# Patient Record
Sex: Male | Born: 1960 | Race: White | Hispanic: No | Marital: Married | Smoking: Never smoker
Health system: Southern US, Community
[De-identification: ages and names within clinical notes are randomized; demographics above are authoritative.]

## PROBLEM LIST (undated history)

## (undated) DIAGNOSIS — I10 Essential (primary) hypertension: Secondary | ICD-10-CM

## (undated) DIAGNOSIS — E039 Hypothyroidism, unspecified: Secondary | ICD-10-CM

## (undated) HISTORY — DX: Essential (primary) hypertension: I10

## (undated) HISTORY — PX: EYE SURGERY: SHX253

---

## 2001-02-27 ENCOUNTER — Encounter: Payer: Self-pay | Admitting: Family Medicine

## 2001-02-27 ENCOUNTER — Ambulatory Visit (HOSPITAL_COMMUNITY): Admission: RE | Admit: 2001-02-27 | Discharge: 2001-02-27 | Payer: Self-pay | Admitting: Family Medicine

## 2003-09-02 ENCOUNTER — Ambulatory Visit (HOSPITAL_COMMUNITY): Admission: RE | Admit: 2003-09-02 | Discharge: 2003-09-02 | Payer: Self-pay | Admitting: Family Medicine

## 2005-11-18 ENCOUNTER — Emergency Department (HOSPITAL_COMMUNITY): Admission: EM | Admit: 2005-11-18 | Discharge: 2005-11-18 | Payer: Self-pay | Admitting: Emergency Medicine

## 2006-02-05 ENCOUNTER — Emergency Department (HOSPITAL_COMMUNITY): Admission: EM | Admit: 2006-02-05 | Discharge: 2006-02-05 | Payer: Self-pay | Admitting: Emergency Medicine

## 2008-09-30 ENCOUNTER — Ambulatory Visit (HOSPITAL_COMMUNITY): Admission: RE | Admit: 2008-09-30 | Discharge: 2008-09-30 | Payer: Self-pay | Admitting: Family Medicine

## 2010-07-10 ENCOUNTER — Emergency Department (HOSPITAL_COMMUNITY): Payer: BC Managed Care – PPO

## 2010-07-10 ENCOUNTER — Emergency Department (HOSPITAL_COMMUNITY)
Admission: EM | Admit: 2010-07-10 | Discharge: 2010-07-10 | Disposition: A | Discharge status: Home / Self Care | Payer: BC Managed Care – PPO | Attending: Emergency Medicine | Admitting: Emergency Medicine

## 2010-07-10 DIAGNOSIS — E119 Type 2 diabetes mellitus without complications: Secondary | ICD-10-CM | POA: Insufficient documentation

## 2010-07-10 DIAGNOSIS — R4182 Altered mental status, unspecified: Secondary | ICD-10-CM | POA: Insufficient documentation

## 2010-07-10 DIAGNOSIS — E039 Hypothyroidism, unspecified: Secondary | ICD-10-CM | POA: Insufficient documentation

## 2010-07-10 DIAGNOSIS — E7800 Pure hypercholesterolemia, unspecified: Secondary | ICD-10-CM | POA: Insufficient documentation

## 2010-07-10 DIAGNOSIS — Z794 Long term (current) use of insulin: Secondary | ICD-10-CM | POA: Insufficient documentation

## 2010-07-10 DIAGNOSIS — Z7982 Long term (current) use of aspirin: Secondary | ICD-10-CM | POA: Insufficient documentation

## 2010-07-10 DIAGNOSIS — Z79899 Other long term (current) drug therapy: Secondary | ICD-10-CM | POA: Insufficient documentation

## 2010-07-10 DIAGNOSIS — R569 Unspecified convulsions: Secondary | ICD-10-CM | POA: Insufficient documentation

## 2010-07-10 DIAGNOSIS — E860 Dehydration: Secondary | ICD-10-CM | POA: Insufficient documentation

## 2010-07-10 DIAGNOSIS — I10 Essential (primary) hypertension: Secondary | ICD-10-CM | POA: Insufficient documentation

## 2010-07-10 LAB — DIFFERENTIAL
Basophils Absolute: 0 10*3/uL (ref 0.0–0.1)
Basophils Relative: 0 % (ref 0–1)
Eosinophils Absolute: 0.2 10*3/uL (ref 0.0–0.7)
Eosinophils Relative: 2 % (ref 0–5)
Lymphs Abs: 1.1 10*3/uL (ref 0.7–4.0)
Neutrophils Relative %: 86 % — ABNORMAL HIGH (ref 43–77)

## 2010-07-10 LAB — URINALYSIS, ROUTINE W REFLEX MICROSCOPIC
Glucose, UA: NEGATIVE mg/dL
Ketones, ur: 15 mg/dL — AB
Leukocytes, UA: NEGATIVE
Protein, ur: 30 mg/dL — AB
pH: 5.5 (ref 5.0–8.0)

## 2010-07-10 LAB — BASIC METABOLIC PANEL
CO2: 27 mEq/L (ref 19–32)
Chloride: 100 mEq/L (ref 96–112)
Creatinine, Ser: 1.44 mg/dL — ABNORMAL HIGH (ref 0.50–1.35)
GFR calc Af Amer: >60 mL/min (ref >60)
Potassium: 3.6 mEq/L (ref 3.5–5.1)
Sodium: 137 mEq/L (ref 135–145)

## 2010-07-10 LAB — CBC
MCV: 88.6 fL (ref 78.0–100.0)
Platelets: 274 10*3/uL (ref 150–400)
RBC: 4.58 MIL/uL (ref 4.22–5.81)
RDW: 12.5 % (ref 11.5–15.5)
WBC: 14 10*3/uL — ABNORMAL HIGH (ref 4.0–10.5)

## 2010-07-10 LAB — URINE MICROSCOPIC-ADD ON

## 2010-07-10 LAB — CALCIUM: Calcium: 10.5 mg/dL (ref 8.4–10.5)

## 2010-07-10 LAB — MAGNESIUM: Magnesium: 2.3 mg/dL (ref 1.5–2.5)

## 2010-07-10 LAB — CK TOTAL AND CKMB (NOT AT ARMC)
CK, MB: 10.9 ng/mL (ref 0.3–4.0)
Relative Index: 1.5 (ref 0.0–2.5)
Total CK: 719 U/L — ABNORMAL HIGH (ref 7–232)

## 2010-07-11 LAB — GLUCOSE, CAPILLARY

## 2010-08-11 ENCOUNTER — Other Ambulatory Visit: Payer: Self-pay | Admitting: Neurology

## 2010-08-11 DIAGNOSIS — R569 Unspecified convulsions: Secondary | ICD-10-CM

## 2010-08-18 ENCOUNTER — Ambulatory Visit (HOSPITAL_COMMUNITY)
Admission: RE | Admit: 2010-08-18 | Discharge: 2010-08-18 | Disposition: A | Discharge status: Home / Self Care | Payer: BC Managed Care – PPO | Source: Ambulatory Visit | Attending: Neurology | Admitting: Neurology

## 2010-08-18 DIAGNOSIS — R569 Unspecified convulsions: Secondary | ICD-10-CM | POA: Insufficient documentation

## 2010-08-18 MED ORDER — GADOBENATE DIMEGLUMINE 529 MG/ML IV SOLN
20.0000 mL | Freq: Once | INTRAVENOUS | Status: AC | PRN
  Administered 2010-08-18: 20 mL via INTRAVENOUS

## 2011-01-20 ENCOUNTER — Other Ambulatory Visit (HOSPITAL_COMMUNITY): Payer: Self-pay | Admitting: Internal Medicine

## 2011-01-20 DIAGNOSIS — M2350 Chronic instability of knee, unspecified knee: Secondary | ICD-10-CM

## 2011-01-23 ENCOUNTER — Ambulatory Visit (HOSPITAL_COMMUNITY)
Admission: RE | Admit: 2011-01-23 | Discharge: 2011-01-23 | Disposition: A | Discharge status: Home / Self Care | Payer: BC Managed Care – PPO | Source: Ambulatory Visit | Attending: Internal Medicine | Admitting: Internal Medicine

## 2011-01-23 DIAGNOSIS — M2350 Chronic instability of knee, unspecified knee: Secondary | ICD-10-CM

## 2011-01-23 DIAGNOSIS — M23329 Other meniscus derangements, posterior horn of medial meniscus, unspecified knee: Secondary | ICD-10-CM | POA: Insufficient documentation

## 2011-01-23 DIAGNOSIS — M25569 Pain in unspecified knee: Secondary | ICD-10-CM | POA: Insufficient documentation

## 2011-02-21 ENCOUNTER — Ambulatory Visit (INDEPENDENT_AMBULATORY_CARE_PROVIDER_SITE_OTHER): Payer: BC Managed Care – PPO | Admitting: Orthopedic Surgery

## 2011-02-21 ENCOUNTER — Encounter: Payer: Self-pay | Admitting: Orthopedic Surgery

## 2011-02-21 VITALS — BP 138/70 | Ht 71.0 in | Wt 248.0 lb

## 2011-02-21 DIAGNOSIS — IMO0002 Reserved for concepts with insufficient information to code with codable children: Secondary | ICD-10-CM

## 2011-02-21 DIAGNOSIS — M1710 Unilateral primary osteoarthritis, unspecified knee: Secondary | ICD-10-CM | POA: Insufficient documentation

## 2011-02-21 DIAGNOSIS — M23329 Other meniscus derangements, posterior horn of medial meniscus, unspecified knee: Secondary | ICD-10-CM

## 2011-02-21 NOTE — Patient Instructions (Signed)
Torn medial meniscus  Recommend  Surgery: arthroscopy right knee partial medial menisectomy  Plan:  Feb 22nd surgery

## 2011-02-21 NOTE — Progress Notes (Signed)
Patient ID: OLSON LUCARELLI, male   DOB: 02/21/1960, 51 y.o.   MRN: [id number]  This procedure has been fully reviewed with the patient and written informed consent has been obtained.  Subjective:    Martin Morales is a 51 y.o. male who presents as a referral from [facility] her RIGHT knee pain present for the last 3 months.  The pain came on suddenly and no history of injury.  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  He complains of 3/10 dropping pain which seems to come and go and is worse by walking.  Fortunately is not having any locking or catching just swelling and pain.  The patient is already had an MRI done.  I was able to look at the MRI and I agree that it shows a large oblique tear posterior horn medial meniscus with mild degenerative changes medial and patellofemoral compartments.  It is unlikely that his knee will improve with his activity level at work.  We discussed possible treatments including surgical.  He is agreeable to surgical treatment.   The following portions of the patient's history were reviewed and updated as appropriate: allergies, current medications, past family history, past medical history, past social history, past surgical history and problem list.   Review of Systems A comprehensive review of systems was negative except for: seasonal allergies   Objective:    BP 138/70  Ht 5\' 11"  (1.803 m)  Wt 248 lb (112.492 kg)  BMI 34.59 kg/m2  Physical Exam(12) GENERAL: normal development   CDV: pulses are normal   Skin: KNEES ARE DRY, ROUGH AND SCALEY FORM CHRONIC KNEELING   Lymph: nodes were not palpable/normal  Psychiatric: awake, alert and oriented  Neuro: normal sensation  Upper extremity exam  Inspection and palpation revealed no abnormalities in the upper extremities.  Range of motion is full without contracture.  Motor exam is normal with grade 5 strength.  The joints are  fully reduced without subluxation.  There is no atrophy or tremor and muscle tone is normal.  All joints are stable.     Right knee: positive exam findings: crepitus, medial joint line tenderness, positive McMurray and ROM = 130 DEGREES., negative exam findings: no effusion, no erythema, ACL stable, PCL stable, MCL stable, LCL stable and no patellar laxity and POSITIVE SCREW HOME TEST AND aPLEYS MODIFIRED TEST   Left knee:  normal and no effusion, full active range of motion, no joint line tenderness, ligamentous structures intact.   X-ray mri ONLY:   Assessment:    Right MEDIAL MENISCUS TEAR AND OA RIGHT KNEE     Plan:    SARK P MEDIAL MENISECTOMY   OOW 3 WEEKS Discussed the postoperative treatment plan which will include therapy for 3 weeks out of work for 3 weeks.  He will have some residual pain and he was made aware of this from his osteoarthritis.

## 2011-02-27 ENCOUNTER — Encounter (HOSPITAL_COMMUNITY): Payer: Self-pay | Admitting: Pharmacy Technician

## 2011-02-27 ENCOUNTER — Encounter (HOSPITAL_COMMUNITY)
Admission: RE | Admit: 2011-02-27 | Discharge: 2011-02-27 | Disposition: A | Discharge status: Home / Self Care | Payer: BC Managed Care – PPO | Source: Ambulatory Visit | Attending: Orthopedic Surgery | Admitting: Orthopedic Surgery

## 2011-02-27 ENCOUNTER — Other Ambulatory Visit: Payer: Self-pay

## 2011-02-27 ENCOUNTER — Encounter (HOSPITAL_COMMUNITY): Payer: Self-pay

## 2011-02-27 HISTORY — DX: Hypothyroidism, unspecified: E03.9

## 2011-02-27 LAB — BASIC METABOLIC PANEL
CO2: 30 mEq/L (ref 19–32)
Calcium: 10 mg/dL (ref 8.4–10.5)
Creatinine, Ser: 1.32 mg/dL (ref 0.50–1.35)
GFR calc non Af Amer: 61 mL/min — ABNORMAL LOW (ref >90)
Glucose, Bld: 129 mg/dL — ABNORMAL HIGH (ref 70–99)
Sodium: 141 mEq/L (ref 135–145)

## 2011-02-27 LAB — SURGICAL PCR SCREEN: Staphylococcus aureus: NEGATIVE

## 2011-02-27 LAB — HEMOGLOBIN AND HEMATOCRIT, BLOOD: HCT: 41.4 % (ref 39.0–52.0)

## 2011-02-27 NOTE — Patient Instructions (Addendum)
20 JLON BETKER  02/27/2011   Your procedure is scheduled on:   03/03/2011  Report to [facility] at  705  AM.  Call this number if you have problems the morning of surgery: [phone]   Remember:   Do not eat food:After Midnight.  May have clear liquids:until Midnight .  Clear liquids include soda, tea, black coffee, apple or grape juice, broth.  Take these medicines the morning of surgery with A SIP OF WATER:  Synthroid,caduet   Do not wear jewelry, make-up or nail polish.  Do not wear lotions, powders, or perfumes. You may wear deodorant.  Do not shave 48 hours prior to surgery.  Do not bring valuables to the hospital.  Contacts, dentures or bridgework may not be worn into surgery.  Leave suitcase in the car. After surgery it may be brought to your room.  For patients admitted to the hospital, checkout time is 11:00 AM the day of discharge.   Patients discharged the day of surgery will not be allowed to drive home.  Name and phone number of your driver: family  Special Instructions: CHG Shower Use Special Wash: 1/2 bottle night before surgery and 1/2 bottle morning of surgery.   Please read over the following fact sheets that you were given: Pain Booklet, MRSA Information, Surgical Site Infection Prevention, Anesthesia Post-op Instructions and Care and Recovery After Surgery Arthroscopic Procedure, Knee An arthroscopic procedure can find what is wrong with your knee. PROCEDURE Arthroscopy is a surgical technique that allows your orthopedic surgeon to diagnose and treat your knee injury with accuracy. They will look into your knee through a small instrument. This is almost like a small (pencil sized) telescope. Because arthroscopy affects your knee less than open knee surgery, you can anticipate a more rapid recovery. Taking an active role by following your caregiver's instructions will help with rapid and complete recovery. Use crutches, rest, elevation, ice, and knee exercises as  instructed. The length of recovery depends on various factors including type of injury, age, physical condition, medical conditions, and your rehabilitation. Your knee is the joint between the large bones (femur and tibia) in your leg. Cartilage covers these bone ends which are smooth and slippery and allow your knee to bend and move smoothly. Two menisci, thick, semi-lunar shaped pads of cartilage which form a rim inside the joint, help absorb shock and stabilize your knee. Ligaments bind the bones together and support your knee joint. Muscles move the joint, help support your knee, and take stress off the joint itself. Because of this all programs and physical therapy to rehabilitate an injured or repaired knee require rebuilding and strengthening your muscles. AFTER THE PROCEDURE  After the procedure, you will be moved to a recovery area until most of the effects of the medication have worn off. Your caregiver will discuss the test results with you.   Only take over-the-counter or prescription medicines for pain, discomfort, or fever as directed by your caregiver.  SEEK MEDICAL CARE IF:   You have increased bleeding from your wounds.   You see redness, swelling, or have increasing pain in your wounds.   You have pus coming from your wound.   You have an oral temperature above 102 F (38.9 C).   You notice a bad smell coming from the wound or dressing.   You have severe pain with any motion of your knee.  SEEK IMMEDIATE MEDICAL CARE IF:   You develop a rash.   You have difficulty  breathing.   You have any allergic problems.  Document Released: 12/24/1999 Document Revised: 09/07/2010 Document Reviewed: 07/17/2007 [facility] Patient Information 2012 [place], [place].PATIENT INSTRUCTIONS POST-ANESTHESIA  IMMEDIATELY FOLLOWING SURGERY:  Do not drive or operate machinery for the first twenty four hours after surgery.  Do not make any important decisions for twenty four hours after surgery  or while taking narcotic pain medications or sedatives.  If you develop intractable nausea and vomiting or a severe headache please notify your doctor immediately.  FOLLOW-UP:  Please make an appointment with your surgeon as instructed. You do not need to follow up with anesthesia unless specifically instructed to do so.  WOUND CARE INSTRUCTIONS (if applicable):  Keep a dry clean dressing on the anesthesia/puncture wound site if there is drainage.  Once the wound has quit draining you may leave it open to air.  Generally you should leave the bandage intact for twenty four hours unless there is drainage.  If the epidural site drains for more than 36-48 hours please call the anesthesia department.  QUESTIONS?:  Please feel free to call your physician or the hospital operator if you have any questions, and they will be happy to assist you.     [facility] Anesthesia Department [address] [place] [phone]

## 2011-03-01 ENCOUNTER — Telehealth: Payer: Self-pay | Admitting: Orthopedic Surgery

## 2011-03-01 NOTE — Telephone Encounter (Signed)
Contacted insurer, [place], [place] [phone], regarding out-patient surgery scheduled 03/03/11 at [facility] for RT knee, CPT [phone] and [phone], ICD9 codes 717.2, 715.96. Spoke w/Tyrone S. - no pre-certification is required. Ref# his name and today's date.

## 2011-03-02 NOTE — H&P (Signed)
CC: right knee pain   Martin Morales is a 51 y.o. male who presents as a referral from [facility] her RIGHT knee pain present for the last 3 months. The pain came on suddenly and no history of injury. 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 He complains of 3/10 dropping pain which seems to come and go and is worse by walking. Fortunately is not having any locking or catching just swelling and pain. The patient is already had an MRI done. I was able to look at the MRI and I agree that it shows a large oblique tear posterior horn medial meniscus with mild degenerative changes medial and patellofemoral compartments. It is unlikely that his knee will improve with his activity level at work. We discussed possible treatments including surgical. He is agreeable to surgical treatment.  The following portions of the patient's history were reviewed and updated as appropriate: allergies, current medications, past family history, past medical history, past social history, past surgical history and problem list.  Review of Systems  A comprehensive review of systems was negative except for: seasonal allergies  Objective:   BP 138/70  Ht 5\' 11"  (1.803 m)  Wt 248 lb (112.492 kg)  BMI 34.59 kg/m2  Physical Exam(12)  GENERAL: normal development  CDV: pulses are normal  Skin: KNEES ARE DRY, ROUGH AND SCALEY FORM CHRONIC KNEELING  Lymph: nodes were not palpable/normal  Psychiatric: awake, alert and oriented  Neuro: normal sensation  Upper extremity exam  Inspection and palpation revealed no abnormalities in the upper extremities. Range of motion is full without contracture.  Motor exam is normal with grade 5 strength.  The joints are fully reduced without subluxation.  There is no atrophy or tremor and muscle tone is normal. All joints are stable.  Right knee:  positive exam findings: crepitus, medial joint line tenderness, positive  McMurray and ROM = 130 DEGREES., negative exam findings: no effusion, no erythema, ACL stable, PCL stable, MCL stable, LCL stable and no patellar laxity and POSITIVE SCREW HOME TEST AND aPLEYS MODIFIRED TEST   Left knee:  normal and no effusion, full active range of motion, no joint line tenderness, ligamentous structures intact.   X-ray mri ONLY:  *RADIOLOGY REPORT*  Clinical Data: Right knee pain for 2 months.  MRI OF THE RIGHT KNEE WITHOUT CONTRAST  Technique: Multiplanar, multisequence MR imaging was performed. No  intravenous contrast was administered.  Comparison: None.  Findings: The patient has a large oblique tear in the posterior  horn of the medial meniscus. The tear also has a prominent radial  component centrally in the posterior horn. The lateral meniscus,  anterior and posterior cruciate ligaments, medial and lateral  collateral ligament complexes and extensor mechanism of the knee  are all intact.  There is some degenerative disease along the posterior aspect of  the medial femoral condyle where there is hyaline cartilage loss  and some subchondral cyst formation. Mild chondromalacia patella  is also seen along the medial patellar facet. Small joint effusion  is noted. No Baker's cyst.  IMPRESSION:  1. Large oblique tear posterior horn medial meniscus extends to  the meniscal undersurface. The tear also has a prominent radial  component centrally in the posterior horn.  2. Mild appearing degenerative change medial and patellofemoral  compartments.  Original Report Authenticated By: Bernadene Bell. Maricela Curet, M.D.  Assessment:   Right MEDIAL MENISCUS TEAR AND OA RIGHT KNEE  Plan:   SARK P MEDIAL MENISECTOMY  OOW 3 WEEKS  Discussed the postoperative treatment plan which will include therapy for 3 weeks out of work for 3 weeks. He will have some residual pain and he was made aware of this from his osteoarthritis.

## 2011-03-03 ENCOUNTER — Ambulatory Visit (HOSPITAL_COMMUNITY)
Admission: RE | Admit: 2011-03-03 | Discharge: 2011-03-03 | Disposition: A | Discharge status: Home / Self Care | Payer: BC Managed Care – PPO | Source: Ambulatory Visit | Attending: Orthopedic Surgery | Admitting: Orthopedic Surgery

## 2011-03-03 ENCOUNTER — Encounter (HOSPITAL_COMMUNITY): Payer: Self-pay | Admitting: Anesthesiology

## 2011-03-03 ENCOUNTER — Encounter (HOSPITAL_COMMUNITY)
Admission: RE | Disposition: A | Discharge status: Home / Self Care | Payer: Self-pay | Source: Ambulatory Visit | Attending: Orthopedic Surgery

## 2011-03-03 ENCOUNTER — Ambulatory Visit (HOSPITAL_COMMUNITY): Payer: BC Managed Care – PPO | Admitting: Anesthesiology

## 2011-03-03 ENCOUNTER — Encounter (HOSPITAL_COMMUNITY): Payer: Self-pay | Admitting: *Deleted

## 2011-03-03 DIAGNOSIS — E119 Type 2 diabetes mellitus without complications: Secondary | ICD-10-CM | POA: Insufficient documentation

## 2011-03-03 DIAGNOSIS — IMO0002 Reserved for concepts with insufficient information to code with codable children: Secondary | ICD-10-CM | POA: Insufficient documentation

## 2011-03-03 DIAGNOSIS — M1710 Unilateral primary osteoarthritis, unspecified knee: Secondary | ICD-10-CM

## 2011-03-03 DIAGNOSIS — Z01812 Encounter for preprocedural laboratory examination: Secondary | ICD-10-CM | POA: Insufficient documentation

## 2011-03-03 DIAGNOSIS — M179 Osteoarthritis of knee, unspecified: Secondary | ICD-10-CM

## 2011-03-03 DIAGNOSIS — Z79899 Other long term (current) drug therapy: Secondary | ICD-10-CM | POA: Insufficient documentation

## 2011-03-03 DIAGNOSIS — I10 Essential (primary) hypertension: Secondary | ICD-10-CM | POA: Insufficient documentation

## 2011-03-03 DIAGNOSIS — Z01810 Encounter for preprocedural cardiovascular examination: Secondary | ICD-10-CM | POA: Insufficient documentation

## 2011-03-03 DIAGNOSIS — Z794 Long term (current) use of insulin: Secondary | ICD-10-CM | POA: Insufficient documentation

## 2011-03-03 DIAGNOSIS — M23329 Other meniscus derangements, posterior horn of medial meniscus, unspecified knee: Secondary | ICD-10-CM

## 2011-03-03 SURGERY — ARTHROSCOPY, KNEE, WITH MEDIAL MENISCECTOMY
Anesthesia: General | Site: Knee | Laterality: Right | Wound class: Clean

## 2011-03-03 MED ORDER — CEFAZOLIN SODIUM-DEXTROSE 2-3 GM-% IV SOLR
INTRAVENOUS | Status: AC
  Filled 2011-03-03: qty 50

## 2011-03-03 MED ORDER — SODIUM CHLORIDE 0.9 % IR SOLN
Status: DC | PRN
  Administered 2011-03-03: 1000 mL

## 2011-03-03 MED ORDER — ONDANSETRON HCL 4 MG/2ML IJ SOLN
INTRAMUSCULAR | Status: AC
  Administered 2011-03-03: 4 mg via INTRAVENOUS
  Filled 2011-03-03: qty 2

## 2011-03-03 MED ORDER — CELECOXIB 100 MG PO CAPS
400.0000 mg | ORAL_CAPSULE | Freq: Once | ORAL | Status: AC
  Administered 2011-03-03: 400 mg via ORAL

## 2011-03-03 MED ORDER — ONDANSETRON HCL 4 MG/2ML IJ SOLN: 4.0000 mg | Freq: Once | INTRAMUSCULAR | Status: DC

## 2011-03-03 MED ORDER — ACETAMINOPHEN 10 MG/ML IV SOLN
1000.0000 mg | Freq: Once | INTRAVENOUS | Status: AC
  Administered 2011-03-03: 1000 mg via INTRAVENOUS

## 2011-03-03 MED ORDER — ACETAMINOPHEN 10 MG/ML IV SOLN
INTRAVENOUS | Status: AC
  Administered 2011-03-03: 1000 mg via INTRAVENOUS
  Filled 2011-03-03: qty 100

## 2011-03-03 MED ORDER — EPINEPHRINE HCL 1 MG/ML IJ SOLN
INTRAMUSCULAR | Status: AC
  Filled 2011-03-03: qty 5

## 2011-03-03 MED ORDER — CELECOXIB 100 MG PO CAPS
ORAL_CAPSULE | ORAL | Status: AC
  Administered 2011-03-03: 400 mg via ORAL
  Filled 2011-03-03: qty 4

## 2011-03-03 MED ORDER — ACETAMINOPHEN 325 MG PO TABS: 325.0000 mg | ORAL_TABLET | ORAL | Status: DC | PRN

## 2011-03-03 MED ORDER — MIDAZOLAM HCL 2 MG/2ML IJ SOLN
1.0000 mg | INTRAMUSCULAR | Status: DC | PRN
  Administered 2011-03-03: 2 mg via INTRAVENOUS

## 2011-03-03 MED ORDER — CHLORHEXIDINE GLUCONATE 4 % EX LIQD
60.0000 mL | Freq: Once | CUTANEOUS | Status: DC
  Filled 2011-03-03: qty 60

## 2011-03-03 MED ORDER — ONDANSETRON HCL 4 MG/2ML IJ SOLN
INTRAMUSCULAR | Status: AC
  Filled 2011-03-03: qty 2

## 2011-03-03 MED ORDER — ONDANSETRON HCL 4 MG/2ML IJ SOLN
4.0000 mg | Freq: Once | INTRAMUSCULAR | Status: AC | PRN
  Administered 2011-03-03: 4 mg via INTRAVENOUS

## 2011-03-03 MED ORDER — BUPIVACAINE-EPINEPHRINE PF 0.5-1:200000 % IJ SOLN
INTRAMUSCULAR | Status: AC
  Filled 2011-03-03: qty 20

## 2011-03-03 MED ORDER — FENTANYL CITRATE 0.05 MG/ML IJ SOLN
INTRAMUSCULAR | Status: DC | PRN
  Administered 2011-03-03 (×2): 25 ug via INTRAVENOUS
  Administered 2011-03-03: 50 ug via INTRAVENOUS

## 2011-03-03 MED ORDER — CEFAZOLIN SODIUM 1-5 GM-% IV SOLN
INTRAVENOUS | Status: DC | PRN
  Administered 2011-03-03: 1 g via INTRAVENOUS

## 2011-03-03 MED ORDER — LACTATED RINGERS IV SOLN
INTRAVENOUS | Status: DC
  Administered 2011-03-03: 09:00:00 via INTRAVENOUS

## 2011-03-03 MED ORDER — LIDOCAINE HCL 1 % IJ SOLN
INTRAMUSCULAR | Status: DC | PRN
  Administered 2011-03-03: 40 mg via INTRADERMAL

## 2011-03-03 MED ORDER — FENTANYL CITRATE 0.05 MG/ML IJ SOLN
INTRAMUSCULAR | Status: AC
  Filled 2011-03-03: qty 5

## 2011-03-03 MED ORDER — OXYCODONE-ACETAMINOPHEN 5-325 MG PO TABS: 1.0000 | ORAL_TABLET | ORAL | Status: AC | PRN

## 2011-03-03 MED ORDER — GLYCOPYRROLATE 0.2 MG/ML IJ SOLN
0.2000 mg | Freq: Once | INTRAMUSCULAR | Status: AC
  Administered 2011-03-03: 0.2 mg via INTRAVENOUS

## 2011-03-03 MED ORDER — PROPOFOL 10 MG/ML IV EMUL
INTRAVENOUS | Status: AC
  Filled 2011-03-03: qty 20

## 2011-03-03 MED ORDER — FENTANYL CITRATE 0.05 MG/ML IJ SOLN: 25.0000 ug | INTRAMUSCULAR | Status: DC | PRN

## 2011-03-03 MED ORDER — SODIUM CHLORIDE 0.9 % IR SOLN
Status: DC | PRN
  Administered 2011-03-03: 10:00:00

## 2011-03-03 MED ORDER — GLYCOPYRROLATE 0.2 MG/ML IJ SOLN
INTRAMUSCULAR | Status: AC
  Filled 2011-03-03: qty 1

## 2011-03-03 MED ORDER — MIDAZOLAM HCL 2 MG/2ML IJ SOLN
INTRAMUSCULAR | Status: AC
  Filled 2011-03-03: qty 2

## 2011-03-03 MED ORDER — BUPIVACAINE-EPINEPHRINE PF 0.5-1:200000 % IJ SOLN
INTRAMUSCULAR | Status: DC | PRN
  Administered 2011-03-03: 60 mL

## 2011-03-03 MED ORDER — EPHEDRINE SULFATE 50 MG/ML IJ SOLN
INTRAMUSCULAR | Status: DC | PRN
  Administered 2011-03-03: 5 mg via INTRAVENOUS

## 2011-03-03 MED ORDER — LIDOCAINE HCL (PF) 1 % IJ SOLN
INTRAMUSCULAR | Status: AC
  Filled 2011-03-03: qty 5

## 2011-03-03 MED ORDER — ONDANSETRON HCL 4 MG/2ML IJ SOLN
4.0000 mg | Freq: Once | INTRAMUSCULAR | Status: AC
  Administered 2011-03-03: 4 mg via INTRAVENOUS

## 2011-03-03 MED ORDER — PROPOFOL 10 MG/ML IV BOLUS
INTRAVENOUS | Status: DC | PRN
  Administered 2011-03-03: 150 mg via INTRAVENOUS

## 2011-03-03 MED ORDER — CEFAZOLIN SODIUM-DEXTROSE 2-3 GM-% IV SOLR: 2.0000 g | INTRAVENOUS | Status: DC

## 2011-03-03 SURGICAL SUPPLY — 58 items
ARTHROWAND PARAGON T2 (SURGICAL WAND)
BAG HAMPER (MISCELLANEOUS) ×2 IMPLANT
BANDAGE ELASTIC 6 VELCRO NS (GAUZE/BANDAGES/DRESSINGS) ×2 IMPLANT
BANDAGE ELASTIC 6 VELCRO ST LF (GAUZE/BANDAGES/DRESSINGS) ×1 IMPLANT
BLADE AGGRESSIVE PLUS 4.0 (BLADE) ×2 IMPLANT
BLADE SURG SZ11 CARB STEEL (BLADE) ×2 IMPLANT
CHLORAPREP W/TINT 26ML (MISCELLANEOUS) ×2 IMPLANT
CLOTH BEACON ORANGE TIMEOUT ST (SAFETY) ×2 IMPLANT
COOLER CRYO IC GRAV AND TUBE (ORTHOPEDIC SUPPLIES) ×2 IMPLANT
CUFF CRYO KNEE LG 20X31 COOLER (ORTHOPEDIC SUPPLIES) IMPLANT
CUFF CRYO KNEE18X23 MED (MISCELLANEOUS) ×1 IMPLANT
CUFF TOURNIQUET SINGLE 34IN LL (TOURNIQUET CUFF) ×1 IMPLANT
CUFF TOURNIQUET SINGLE 44IN (TOURNIQUET CUFF) IMPLANT
CUTTER ANGLED DBL BITE 4.5 (BURR) IMPLANT
DECANTER SPIKE VIAL GLASS SM (MISCELLANEOUS) ×4 IMPLANT
DRSG XEROFORM 1X8 (GAUZE/BANDAGES/DRESSINGS) ×1 IMPLANT
FLOOR PAD 36X40 (MISCELLANEOUS)
GAUZE SPONGE 4X4 16PLY XRAY LF (GAUZE/BANDAGES/DRESSINGS) ×2 IMPLANT
GAUZE XEROFORM 5X9 LF (GAUZE/BANDAGES/DRESSINGS) ×1 IMPLANT
GLOVE ECLIPSE 6.5 STRL STRAW (GLOVE) ×1 IMPLANT
GLOVE ECLIPSE 7.0 STRL STRAW (GLOVE) ×1 IMPLANT
GLOVE EXAM NITRILE MD LF STRL (GLOVE) ×1 IMPLANT
GLOVE INDICATOR 7.0 STRL GRN (GLOVE) ×2 IMPLANT
GLOVE SKINSENSE NS SZ8.0 LF (GLOVE) ×1
GLOVE SKINSENSE STRL SZ8.0 LF (GLOVE) ×1 IMPLANT
GLOVE SS N UNI LF 8.5 STRL (GLOVE) ×2 IMPLANT
GOWN STRL REIN XL XLG (GOWN DISPOSABLE) ×6 IMPLANT
HLDR LEG FOAM (MISCELLANEOUS) ×1 IMPLANT
IV NS IRRIG 3000ML ARTHROMATIC (IV SOLUTION) ×5 IMPLANT
KIT BLADEGUARD II DBL (SET/KITS/TRAYS/PACK) ×2 IMPLANT
KIT ROOM TURNOVER AP CYSTO (KITS) ×2 IMPLANT
LEG HOLDER FOAM (MISCELLANEOUS) ×1
MANIFOLD NEPTUNE II (INSTRUMENTS) ×2 IMPLANT
MARKER SKIN DUAL TIP RULER LAB (MISCELLANEOUS) ×2 IMPLANT
NDL HYPO 18GX1.5 BLUNT FILL (NEEDLE) ×1 IMPLANT
NDL HYPO 21X1.5 SAFETY (NEEDLE) ×1 IMPLANT
NDL SPNL 18GX3.5 QUINCKE PK (NEEDLE) ×1 IMPLANT
NEEDLE HYPO 18GX1.5 BLUNT FILL (NEEDLE) ×2 IMPLANT
NEEDLE HYPO 21X1.5 SAFETY (NEEDLE) ×2 IMPLANT
NEEDLE SPNL 18GX3.5 QUINCKE PK (NEEDLE) ×2 IMPLANT
NS IRRIG 1000ML POUR BTL (IV SOLUTION) ×2 IMPLANT
PACK ARTHRO LIMB DRAPE STRL (MISCELLANEOUS) ×2 IMPLANT
PAD ABD 5X9 TENDERSORB (GAUZE/BANDAGES/DRESSINGS) ×2 IMPLANT
PAD ARMBOARD 7.5X6 YLW CONV (MISCELLANEOUS) ×2 IMPLANT
PAD FLOOR 36X40 (MISCELLANEOUS) ×1 IMPLANT
PADDING CAST COTTON 6X4 STRL (CAST SUPPLIES) ×2 IMPLANT
SET ARTHROSCOPY INST (INSTRUMENTS) ×2 IMPLANT
SET ARTHROSCOPY PUMP TUBE (IRRIGATION / IRRIGATOR) ×2 IMPLANT
SET BASIN LINEN APH (SET/KITS/TRAYS/PACK) ×2 IMPLANT
SPONGE GAUZE 4X4 12PLY (GAUZE/BANDAGES/DRESSINGS) ×2 IMPLANT
STRIP CLOSURE SKIN 1/2X4 (GAUZE/BANDAGES/DRESSINGS) ×2 IMPLANT
SUT ETHILON 3 0 FSL (SUTURE) ×1 IMPLANT
SYR 30ML LL (SYRINGE) ×2 IMPLANT
SYRINGE 10CC LL (SYRINGE) ×2 IMPLANT
WAND 50 DEG COVAC W/CORD (SURGICAL WAND) ×1 IMPLANT
WAND 90 DEG TURBOVAC W/CORD (SURGICAL WAND) IMPLANT
WAND ARTHRO PARAGON T2 (SURGICAL WAND) IMPLANT
YANKAUER SUCT BULB TIP 10FT TU (MISCELLANEOUS) ×7 IMPLANT

## 2011-03-03 NOTE — Interval H&P Note (Signed)
History and Physical Interval Note:  03/03/2011 8:13 AM  Martin Morales  has presented today for surgery, with the diagnosis of torn medial meniscus right knee  The various methods of treatment have been discussed with the patient and family. After consideration of risks, benefits and other options for treatment, the patient has consented to  Procedure(s) (LRB): RIGHT  KNEE ARTHROSCOPY WITH MEDIAL MENISECTOMY (Right) as a surgical intervention .  The patients' history has been reviewed, patient examined, no change in status, stable for surgery.  I have reviewed the patients' chart and labs.  Questions were answered to the patient's satisfaction.     Fuller Canada

## 2011-03-03 NOTE — Transfer of Care (Signed)
Immediate Anesthesia Transfer of Care Note  Patient: Martin Morales  Procedure(s) Performed: Procedure(s) (LRB): KNEE ARTHROSCOPY WITH MEDIAL MENISECTOMY (Right)  Patient Location: PACU  Anesthesia Type: General  Level of Consciousness: awake, alert  and oriented  Airway & Oxygen Therapy: Patient Spontanous Breathing and Patient connected to face mask oxygen  Post-op Assessment: Report given to PACU RN  Post vital signs: Reviewed and stable  Complications: No apparent anesthesia complications

## 2011-03-03 NOTE — Anesthesia Preprocedure Evaluation (Signed)
Anesthesia Evaluation  Patient identified by MRN, date of birth, ID band Patient awake    Reviewed: Allergy & Precautions, H&P , NPO status , Patient's Chart, lab work & pertinent test results  Airway Mallampati: II TM Distance: >3 FB Neck ROM: Full    Dental No notable dental hx.    Pulmonary asthma ,    Pulmonary exam normal       Cardiovascular hypertension, Pt. on medications Regular Normal    Neuro/Psych Negative Neurological ROS  Negative Psych ROS   GI/Hepatic   Endo/Other  Diabetes mellitus-, Well Controlled, Insulin DependentHypothyroidism   Renal/GU   Genitourinary negative   Musculoskeletal   Abdominal Normal abdominal exam  (+)   Peds  Hematology   Anesthesia Other Findings   Reproductive/Obstetrics                           Anesthesia Physical Anesthesia Plan  ASA: II  Anesthesia Plan: General   Post-op Pain Management:    Induction: Intravenous  Airway Management Planned: LMA  Additional Equipment:   Intra-op Plan:   Post-operative Plan: Extubation in OR  Informed Consent: I have reviewed the patients History and Physical, chart, labs and discussed the procedure including the risks, benefits and alternatives for the proposed anesthesia with the patient or authorized representative who has indicated his/her understanding and acceptance.     Plan Discussed with:   Anesthesia Plan Comments:         Anesthesia Quick Evaluation

## 2011-03-03 NOTE — Anesthesia Postprocedure Evaluation (Signed)
  Anesthesia Post-op Note  Patient: Martin Morales  Procedure(s) Performed: Procedure(s) (LRB): KNEE ARTHROSCOPY WITH MEDIAL MENISECTOMY (Right)  Patient Location: PACU  Anesthesia Type: General  Level of Consciousness: awake, alert  and oriented  Airway and Oxygen Therapy: Patient Spontanous Breathing and Patient connected to face mask oxygen  Post-op Pain: none  Post-op Assessment: Post-op Vital signs reviewed, Patient's Cardiovascular Status Stable, Respiratory Function Stable, Patent Airway and No signs of Nausea or vomiting  Post-op Vital Signs: Reviewed and stable  Complications: No apparent anesthesia complications

## 2011-03-03 NOTE — Op Note (Signed)
Preop diagnosis osteoarthritis torn medial meniscus right knee  Postop diagnosis same  Procedure arthroscopy right knee partial medial meniscectomy  Surgeon Romeo Apple  Anesthesia Gen.  Operative findings complex tear posterior horn medial meniscus, remaining compartments articular cartilage normal  Indications for procedure pain mechanical symptoms unresponsive to nonoperative treatment  The patient was identified in the preop holding area as Martin Morales the right knee was confirmed as a surgical site marked. The chart was reviewed  The patient was taken to the operating room he was given appropriate preoperative antibiotic and general anesthesia was administered. The right leg was placed in the arthroscopic leg holder, the left leg was placed in a well leg holder  The right leg was then prepped and draped with ChloraPrep  The surgical site was confirmed and the timeout procedure was completed  The lateral portal was injected with Marcaine with epinephrine solution and a stab wound was made. The scope was placed in the lateral portal into the medial compartment. A diagnostic arthroscopy was completed Vytorin the knee. A medial portal was established in the same fashion and a probe was placed into the joint. A diagnostic arthroscopy to be was repeated using a probe to palpate intra-articular structures  The medial compartment was normal in terms of the articular surface. There was a tear of the posterior horn of the medial meniscus it was complex with multiple tears.  The notch was normal.  The lateral compartment was normal.  The patellofemoral joint was normal.  A duckbill forceps was used to morcellized the meniscal tear the fragments were removed with a motorized shaver. The meniscus was then balanced with an ArthroCare wand 50 probe.  A probe was then used to confirm a stable rim.  The knee was irrigated meniscal fragments were remaining were removed. The portals were closed  with 3-0 nylon suture to on the lateral side one on the medial side. The knee joint was then injected with 45 cc of Marcaine with epinephrine. A sterile dressing was applied followed by an Ace bandage and a Cryo/Cuff.  The patient was extubated and taken to the recovery room in stable condition.

## 2011-03-03 NOTE — Anesthesia Procedure Notes (Signed)
Procedure Name: LMA Insertion Date/Time: 03/03/2011 9:10 AM Performed by: Glynn Octave Pre-anesthesia Checklist: Patient identified, Patient being monitored, Emergency Drugs available, Timeout performed and Suction available Patient Re-evaluated:Patient Re-evaluated prior to inductionOxygen Delivery Method: Circle System Utilized Preoxygenation: Pre-oxygenation with 100% oxygen Intubation Type: IV induction Ventilation: Mask ventilation without difficulty LMA: LMA inserted LMA Size: 4.0 Number of attempts: 1 Placement Confirmation: positive ETCO2 and breath sounds checked- equal and bilateral

## 2011-03-03 NOTE — Brief Op Note (Signed)
03/03/2011  10:01 AM  PATIENT:  Martin Morales  51 y.o. male  PRE-OPERATIVE DIAGNOSIS:  torn medial meniscus right knee  POST-OPERATIVE DIAGNOSIS:  torn medial meniscus right knee  PROCEDURE:  Procedure(s) (LRB): KNEE ARTHROSCOPY WITH MEDIAL MENISECTOMY (Right)  SURGEON:  Surgeon(s) and Role:    * Fuller Canada, MD - Primary  PHYSICIAN ASSISTANT:   ASSISTANTS: none   ANESTHESIA:   general  EBL:  Total I/O In: -  Out: 20 [Blood:20]  BLOOD ADMINISTERED:none  DRAINS: none   LOCAL MEDICATIONS USED:  MARCAINE   With epi 60 cc total   SPECIMEN:  No Specimen  DISPOSITION OF SPECIMEN:  N/A  COUNTS:  YES  TOURNIQUET:  * Missing tourniquet times found for documented tourniquets in log:  [id number] *  DICTATION: .Dragon Dictation  PLAN OF CARE: discharge today/home   PATIENT DISPOSITION:  PACU - hemodynamically stable.   Delay start of Pharmacological VTE agent (>24hrs) due to surgical blood loss or risk of bleeding: not applicable

## 2011-03-06 ENCOUNTER — Encounter: Payer: Self-pay | Admitting: Orthopedic Surgery

## 2011-03-06 ENCOUNTER — Ambulatory Visit (INDEPENDENT_AMBULATORY_CARE_PROVIDER_SITE_OTHER): Payer: BC Managed Care – PPO | Admitting: Orthopedic Surgery

## 2011-03-06 VITALS — Ht 71.0 in | Wt 248.0 lb

## 2011-03-06 DIAGNOSIS — Z9889 Other specified postprocedural states: Secondary | ICD-10-CM

## 2011-03-06 NOTE — Progress Notes (Signed)
Patient ID: Martin Morales, male   DOB: [birth year], 51 y.o.   MRN: [id number] Chief Complaint  Patient presents with  . Follow-up    Post op #1, right knee scope.   Surgery date February 25  Partial medial meniscectomy RIGHT knee Paient is doing well he has no filled his pain medicaion  At this point. His knee flexion is about 85  We took his sutures out  Recommend followup in 2 weeks, start therapy as soon as possible with a goal of return to work on March 8

## 2011-03-06 NOTE — Patient Instructions (Addendum)
Call hand and rehab to start PT

## 2011-03-16 ENCOUNTER — Encounter: Payer: Self-pay | Admitting: Orthopedic Surgery

## 2011-03-16 ENCOUNTER — Ambulatory Visit (INDEPENDENT_AMBULATORY_CARE_PROVIDER_SITE_OTHER): Payer: BC Managed Care – PPO | Admitting: Orthopedic Surgery

## 2011-03-16 DIAGNOSIS — Z9889 Other specified postprocedural states: Secondary | ICD-10-CM

## 2011-03-16 NOTE — Progress Notes (Signed)
Patient ID: Martin Morales, male   DOB: 11/10/1960, 51 y.o.   MRN: [id number] Chief Complaint  Patient presents with  . Knee Problem    post op 2/25   Status post RIGHT knee arthroscopy with partial medial meniscectomy  Patient completed his therapy at hand and rehabilitation  He is fully recovered with full range of motion no swelling just some mild discomfort  Recommend follow up as needed return to work as he would like and follow up US if any problems

## 2011-03-16 NOTE — Patient Instructions (Signed)
RETURN TO WORK OK (NO NOTE NEEDED)

## 2011-03-27 ENCOUNTER — Other Ambulatory Visit (HOSPITAL_COMMUNITY): Payer: BC Managed Care – PPO

## 2012-10-24 ENCOUNTER — Encounter (INDEPENDENT_AMBULATORY_CARE_PROVIDER_SITE_OTHER): Payer: BC Managed Care – PPO | Admitting: Ophthalmology

## 2012-10-24 DIAGNOSIS — H43819 Vitreous degeneration, unspecified eye: Secondary | ICD-10-CM

## 2012-10-24 DIAGNOSIS — E11359 Type 2 diabetes mellitus with proliferative diabetic retinopathy without macular edema: Secondary | ICD-10-CM

## 2012-10-24 DIAGNOSIS — E1139 Type 2 diabetes mellitus with other diabetic ophthalmic complication: Secondary | ICD-10-CM

## 2012-10-24 DIAGNOSIS — H35039 Hypertensive retinopathy, unspecified eye: Secondary | ICD-10-CM

## 2012-10-24 DIAGNOSIS — I10 Essential (primary) hypertension: Secondary | ICD-10-CM

## 2012-10-24 DIAGNOSIS — H2510 Age-related nuclear cataract, unspecified eye: Secondary | ICD-10-CM

## 2013-02-24 ENCOUNTER — Ambulatory Visit (INDEPENDENT_AMBULATORY_CARE_PROVIDER_SITE_OTHER): Payer: BC Managed Care – PPO | Admitting: Ophthalmology

## 2013-02-24 DIAGNOSIS — E1065 Type 1 diabetes mellitus with hyperglycemia: Secondary | ICD-10-CM

## 2013-02-24 DIAGNOSIS — H43819 Vitreous degeneration, unspecified eye: Secondary | ICD-10-CM

## 2013-02-24 DIAGNOSIS — E11359 Type 2 diabetes mellitus with proliferative diabetic retinopathy without macular edema: Secondary | ICD-10-CM

## 2013-02-24 DIAGNOSIS — I10 Essential (primary) hypertension: Secondary | ICD-10-CM

## 2013-02-24 DIAGNOSIS — H35039 Hypertensive retinopathy, unspecified eye: Secondary | ICD-10-CM

## 2013-02-24 DIAGNOSIS — H2510 Age-related nuclear cataract, unspecified eye: Secondary | ICD-10-CM

## 2013-02-24 DIAGNOSIS — E1039 Type 1 diabetes mellitus with other diabetic ophthalmic complication: Secondary | ICD-10-CM

## 2013-09-01 ENCOUNTER — Ambulatory Visit (INDEPENDENT_AMBULATORY_CARE_PROVIDER_SITE_OTHER): Payer: BC Managed Care – PPO | Admitting: Ophthalmology

## 2013-09-01 DIAGNOSIS — I10 Essential (primary) hypertension: Secondary | ICD-10-CM

## 2013-09-01 DIAGNOSIS — E1065 Type 1 diabetes mellitus with hyperglycemia: Secondary | ICD-10-CM

## 2013-09-01 DIAGNOSIS — H35039 Hypertensive retinopathy, unspecified eye: Secondary | ICD-10-CM

## 2013-09-01 DIAGNOSIS — E1039 Type 1 diabetes mellitus with other diabetic ophthalmic complication: Secondary | ICD-10-CM

## 2013-09-01 DIAGNOSIS — E11359 Type 2 diabetes mellitus with proliferative diabetic retinopathy without macular edema: Secondary | ICD-10-CM

## 2013-09-01 DIAGNOSIS — H2510 Age-related nuclear cataract, unspecified eye: Secondary | ICD-10-CM

## 2013-09-01 DIAGNOSIS — H43819 Vitreous degeneration, unspecified eye: Secondary | ICD-10-CM

## 2013-09-13 ENCOUNTER — Emergency Department (HOSPITAL_COMMUNITY): Payer: BC Managed Care – PPO

## 2013-09-13 ENCOUNTER — Encounter (HOSPITAL_COMMUNITY): Payer: Self-pay | Admitting: Emergency Medicine

## 2013-09-13 ENCOUNTER — Emergency Department (HOSPITAL_COMMUNITY)
Admission: EM | Admit: 2013-09-13 | Discharge: 2013-09-13 | Disposition: A | Discharge status: Home / Self Care | Payer: BC Managed Care – PPO | Attending: Emergency Medicine | Admitting: Emergency Medicine

## 2013-09-13 ENCOUNTER — Encounter (INDEPENDENT_AMBULATORY_CARE_PROVIDER_SITE_OTHER): Payer: Self-pay

## 2013-09-13 DIAGNOSIS — Z79899 Other long term (current) drug therapy: Secondary | ICD-10-CM | POA: Diagnosis not present

## 2013-09-13 DIAGNOSIS — I10 Essential (primary) hypertension: Secondary | ICD-10-CM | POA: Diagnosis not present

## 2013-09-13 DIAGNOSIS — Z794 Long term (current) use of insulin: Secondary | ICD-10-CM | POA: Diagnosis not present

## 2013-09-13 DIAGNOSIS — E119 Type 2 diabetes mellitus without complications: Secondary | ICD-10-CM | POA: Insufficient documentation

## 2013-09-13 DIAGNOSIS — R52 Pain, unspecified: Secondary | ICD-10-CM | POA: Insufficient documentation

## 2013-09-13 DIAGNOSIS — Z7982 Long term (current) use of aspirin: Secondary | ICD-10-CM | POA: Insufficient documentation

## 2013-09-13 DIAGNOSIS — M25539 Pain in unspecified wrist: Secondary | ICD-10-CM | POA: Insufficient documentation

## 2013-09-13 DIAGNOSIS — M25532 Pain in left wrist: Secondary | ICD-10-CM

## 2013-09-13 DIAGNOSIS — J45909 Unspecified asthma, uncomplicated: Secondary | ICD-10-CM | POA: Diagnosis not present

## 2013-09-13 DIAGNOSIS — E039 Hypothyroidism, unspecified: Secondary | ICD-10-CM | POA: Insufficient documentation

## 2013-09-13 MED ORDER — NAPROXEN 500 MG PO TABS: 500.0000 mg | ORAL_TABLET | Freq: Two times a day (BID) | ORAL | Status: DC

## 2013-09-13 NOTE — Discharge Instructions (Signed)
Wrist Pain °A wrist sprain happens when the bands of tissue that hold the wrist joints together (ligament) stretch too much or tear. A wrist strain happens when muscles or bands of tissue that connect muscles to bones (tendons) are stretched or pulled. °HOME CARE °· Put ice on the injured area. °¨ Put ice in a plastic bag. °¨ Place a towel between your skin and the bag. °¨ Leave the ice on for 15-20 minutes, 03-04 times a day, for the first 2 days. °· Raise (elevate) the injured wrist to lessen puffiness (swelling). °· Rest the injured wrist for at least 48 hours or as told by your doctor. °· Wear a splint, cast, or an elastic wrap as told by your doctor. °· Only take medicine as told by your doctor. °· Follow up with your doctor as told. This is important. °GET HELP RIGHT AWAY IF:  °· The fingers are puffy, very red, white, or cold and blue. °· The fingers lose feeling (numb) or tingle. °· The pain gets worse. °· It is hard to move the fingers. °MAKE SURE YOU:  °· Understand these instructions. °· Will watch your condition. °· Will get help right away if you are not doing well or get worse. °Document Released: 06/14/2007 Document Revised: 03/20/2011 Document Reviewed: 02/16/2010 °[employer]® Patient Information ©2015 [employer]. This information is not intended to replace advice given to you by your health care provider. Make sure you discuss any questions you have with your health care provider. ° °

## 2013-09-13 NOTE — ED Notes (Signed)
C/o left wrist and hand pain times 2 days.  Denies any injury.

## 2013-09-13 NOTE — ED Provider Notes (Signed)
CSN: [id number]     Arrival date & time 09/13/13  0831 History   First MD Initiated Contact with Patient 09/13/13 [phone]     Chief Complaint  Patient presents with  . Wrist Pain   EMONI YANG is a 53 y.o. male who presents to the Emergency Department complaining of left wrist pain and swelling.  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  He states that his fingers "feel stiff".   he denies known injury, redness, rash, numbness or weakness of the hand or fingers. He also denies pain proximal to the wrist.  Pain is worse with dorsiflexion of the wrist.     (Consider location/radiation/quality/duration/timing/severity/associated sxs/prior Treatment) Patient is a 53 y.o. male presenting with wrist pain.  Wrist Pain This is a new problem. Episode onset: 2 days ago. The problem occurs constantly. The problem has been unchanged. Associated symptoms include arthralgias and joint swelling. Pertinent negatives include no chest pain, chills, fever, headaches, neck pain, numbness, rash, vomiting or weakness. The symptoms are aggravated by twisting and bending. He has tried NSAIDs for the symptoms. The treatment provided mild relief.    Past Medical History  Diagnosis Date  . Asthma   . HTN (hypertension)   . Diabetes mellitus   . Hypothyroidism    Past Surgical History  Procedure Laterality Date  . Eye surgery      left   Family History  Problem Relation Age of Onset  . Cancer    . Asthma    . Diabetes    . Anesthesia problems Neg Hx   . Hypotension Neg Hx   . Malignant hyperthermia Neg Hx   . Pseudochol deficiency Neg Hx    History  Substance Use Topics  . Smoking status: Never Smoker   . Smokeless tobacco: Not on file  . Alcohol Use: No    Review of Systems  Constitutional: Negative for fever and chills.  Cardiovascular: Negative for chest pain.  Gastrointestinal: Negative for vomiting.  Genitourinary:  Negative for dysuria and difficulty urinating.  Musculoskeletal: Positive for arthralgias and joint swelling. Negative for neck pain and neck stiffness.  Skin: Negative for color change, rash and wound.  Neurological: Negative for weakness, numbness and headaches.  All other systems reviewed and are negative.     Allergies  Review of patient's allergies indicates no known allergies.  Home Medications   Prior to Admission medications   Medication Sig Start Date End Date Taking? Authorizing Provider  albuterol (PROVENTIL HFA;VENTOLIN HFA) 108 (90 BASE) MCG/ACT inhaler Inhale 2 puffs into the lungs every 6 (six) hours as needed. Shortness of breath    Historical Provider, MD  amLODipine-atorvastatin (CADUET) 10-40 MG per tablet Take 1 tablet by mouth daily.    Historical Provider, MD  aspirin 325 MG tablet Take 325 mg by mouth daily.    Historical Provider, MD  Insulin Infusion Pump KIT by Does not apply route. novolog pump that gives a minimum dose every 3 minutes and then when patient eats,he figures his carbs and will give a bolus up to 25 units    Historical Provider, MD  levothyroxine (SYNTHROID, LEVOTHROID) 200 MCG tablet Take 200 mcg by mouth daily.     Historical Provider, MD  ramipril (ALTACE) 10 MG capsule Take 10 mg by mouth daily.    Historical Provider, MD   BP 135/66  Pulse 64  Temp(Src) 97.7 F (36.5 C) (Oral)  Resp 18  Ht _0  (1.803 m)  Wt 235 lb (106.595 kg)  BMI 32.79 kg/m2  SpO2 100% Physical Exam  Nursing note and vitals reviewed. Constitutional: He is oriented to person, place, and time. He appears well-developed and well-nourished. No distress.  HENT:  Head: Normocephalic and atraumatic.  Cardiovascular: Normal rate, regular rhythm, normal heart sounds and intact distal pulses.   No murmur heard. Pulmonary/Chest: Effort normal and breath sounds normal. No respiratory distress.  Musculoskeletal: He exhibits edema and tenderness.  Diffuse ttp of the dorsal  left wrist and hand.  Mild STS of the dorsal hand.  Radial pulse is brisk, distal sensation intact.  CR< 2 sec.  No bruising or bony deformity.  Patient has full ROM. No proximal tenderness  Neurological: He is alert and oriented to person, place, and time. He exhibits normal muscle tone. Coordination normal.  Skin: Skin is warm and dry.    ED Course  Procedures (including critical care time) Labs Review Labs Reviewed - No data to display  Imaging Review Dg Wrist Complete Left  09/13/2013   CLINICAL DATA:  Wrist pain.  He is a Development worker, community.  EXAM: LEFT WRIST - COMPLETE 3+ VIEW  COMPARISON:  None.  FINDINGS: There is no evidence of fracture or dislocation. There is no evidence of arthropathy or other focal bone abnormality. Soft tissues are unremarkable.  IMPRESSION: Negative.   Electronically Signed   By: Maryclare Bean M.D.   On: 09/13/2013 09:50     EKG Interpretation None      MDM   Final diagnoses:  Acute wrist pain, left    Pt with likely tendonitis of the left wrist.  No concerning sx's for infectious process, or carpal tunnel.  NV intact.  No proximal tenderness.  He agrees to symptomatic treatment, elevate , ice and orthopedic f/u in one week if not improved.  VSS.  He appears stable for d/c    Owen Pratte L. Latonia Conrow, PA-C 09/13/13 1019

## 2013-09-14 NOTE — ED Provider Notes (Signed)
Medical screening examination/treatment/procedure(s) were performed by non-physician practitioner and as supervising physician I was immediately available for consultation/collaboration.    Ashdon Gillson D Wynona Duhamel, MD 09/14/13 0702 

## 2013-10-07 ENCOUNTER — Ambulatory Visit: Payer: BC Managed Care – PPO | Admitting: Orthopedic Surgery

## 2014-01-05 ENCOUNTER — Ambulatory Visit (INDEPENDENT_AMBULATORY_CARE_PROVIDER_SITE_OTHER): Payer: BC Managed Care – PPO | Admitting: Ophthalmology

## 2014-02-17 ENCOUNTER — Other Ambulatory Visit: Payer: Self-pay | Admitting: Physical Medicine and Rehabilitation

## 2014-02-17 ENCOUNTER — Ambulatory Visit
Admission: RE | Admit: 2014-02-17 | Discharge: 2014-02-17 | Disposition: A | Discharge status: Home / Self Care | Payer: No Typology Code available for payment source | Source: Ambulatory Visit | Attending: Physical Medicine and Rehabilitation | Admitting: Physical Medicine and Rehabilitation

## 2014-02-17 DIAGNOSIS — Z0000 Encounter for general adult medical examination without abnormal findings: Secondary | ICD-10-CM

## 2014-03-23 ENCOUNTER — Ambulatory Visit (INDEPENDENT_AMBULATORY_CARE_PROVIDER_SITE_OTHER): Payer: 59 | Admitting: Ophthalmology

## 2014-03-23 DIAGNOSIS — H2513 Age-related nuclear cataract, bilateral: Secondary | ICD-10-CM | POA: Diagnosis not present

## 2014-03-23 DIAGNOSIS — I10 Essential (primary) hypertension: Secondary | ICD-10-CM | POA: Diagnosis not present

## 2014-03-23 DIAGNOSIS — E10359 Type 1 diabetes mellitus with proliferative diabetic retinopathy without macular edema: Secondary | ICD-10-CM | POA: Diagnosis not present

## 2014-03-23 DIAGNOSIS — H35033 Hypertensive retinopathy, bilateral: Secondary | ICD-10-CM

## 2014-03-23 DIAGNOSIS — E10319 Type 1 diabetes mellitus with unspecified diabetic retinopathy without macular edema: Secondary | ICD-10-CM | POA: Diagnosis not present

## 2014-03-23 DIAGNOSIS — H43813 Vitreous degeneration, bilateral: Secondary | ICD-10-CM | POA: Diagnosis not present

## 2014-09-24 ENCOUNTER — Ambulatory Visit (INDEPENDENT_AMBULATORY_CARE_PROVIDER_SITE_OTHER): Payer: 59 | Admitting: Ophthalmology

## 2014-10-01 ENCOUNTER — Ambulatory Visit (INDEPENDENT_AMBULATORY_CARE_PROVIDER_SITE_OTHER): Payer: 59 | Admitting: Ophthalmology

## 2014-10-01 DIAGNOSIS — I10 Essential (primary) hypertension: Secondary | ICD-10-CM | POA: Diagnosis not present

## 2014-10-01 DIAGNOSIS — E10311 Type 1 diabetes mellitus with unspecified diabetic retinopathy with macular edema: Secondary | ICD-10-CM

## 2014-10-01 DIAGNOSIS — H35372 Puckering of macula, left eye: Secondary | ICD-10-CM

## 2014-10-01 DIAGNOSIS — E10359 Type 1 diabetes mellitus with proliferative diabetic retinopathy without macular edema: Secondary | ICD-10-CM | POA: Diagnosis not present

## 2014-10-01 DIAGNOSIS — H35033 Hypertensive retinopathy, bilateral: Secondary | ICD-10-CM | POA: Diagnosis not present

## 2014-10-01 DIAGNOSIS — E10351 Type 1 diabetes mellitus with proliferative diabetic retinopathy with macular edema: Secondary | ICD-10-CM | POA: Diagnosis not present

## 2014-10-01 DIAGNOSIS — H43813 Vitreous degeneration, bilateral: Secondary | ICD-10-CM | POA: Diagnosis not present

## 2014-10-26 ENCOUNTER — Encounter (INDEPENDENT_AMBULATORY_CARE_PROVIDER_SITE_OTHER): Payer: 59 | Admitting: Ophthalmology

## 2014-10-26 DIAGNOSIS — E11311 Type 2 diabetes mellitus with unspecified diabetic retinopathy with macular edema: Secondary | ICD-10-CM | POA: Diagnosis not present

## 2014-10-26 DIAGNOSIS — E113512 Type 2 diabetes mellitus with proliferative diabetic retinopathy with macular edema, left eye: Secondary | ICD-10-CM | POA: Diagnosis not present

## 2015-03-03 ENCOUNTER — Ambulatory Visit (INDEPENDENT_AMBULATORY_CARE_PROVIDER_SITE_OTHER): Payer: 59 | Admitting: Ophthalmology

## 2015-04-16 ENCOUNTER — Ambulatory Visit (INDEPENDENT_AMBULATORY_CARE_PROVIDER_SITE_OTHER): Payer: 59 | Admitting: Ophthalmology

## 2015-04-16 DIAGNOSIS — E10311 Type 1 diabetes mellitus with unspecified diabetic retinopathy with macular edema: Secondary | ICD-10-CM | POA: Diagnosis not present

## 2015-04-16 DIAGNOSIS — H35033 Hypertensive retinopathy, bilateral: Secondary | ICD-10-CM

## 2015-04-16 DIAGNOSIS — E103591 Type 1 diabetes mellitus with proliferative diabetic retinopathy without macular edema, right eye: Secondary | ICD-10-CM

## 2015-04-16 DIAGNOSIS — E103512 Type 1 diabetes mellitus with proliferative diabetic retinopathy with macular edema, left eye: Secondary | ICD-10-CM

## 2015-04-16 DIAGNOSIS — H4311 Vitreous hemorrhage, right eye: Secondary | ICD-10-CM

## 2015-04-16 DIAGNOSIS — H43811 Vitreous degeneration, right eye: Secondary | ICD-10-CM

## 2015-04-16 DIAGNOSIS — H35372 Puckering of macula, left eye: Secondary | ICD-10-CM

## 2015-04-16 DIAGNOSIS — I10 Essential (primary) hypertension: Secondary | ICD-10-CM | POA: Diagnosis not present

## 2015-10-19 ENCOUNTER — Ambulatory Visit (INDEPENDENT_AMBULATORY_CARE_PROVIDER_SITE_OTHER): Payer: 59 | Admitting: Ophthalmology

## 2015-10-19 DIAGNOSIS — H35033 Hypertensive retinopathy, bilateral: Secondary | ICD-10-CM

## 2015-10-19 DIAGNOSIS — E11319 Type 2 diabetes mellitus with unspecified diabetic retinopathy without macular edema: Secondary | ICD-10-CM

## 2015-10-19 DIAGNOSIS — E113593 Type 2 diabetes mellitus with proliferative diabetic retinopathy without macular edema, bilateral: Secondary | ICD-10-CM

## 2015-10-19 DIAGNOSIS — H43811 Vitreous degeneration, right eye: Secondary | ICD-10-CM

## 2015-10-19 DIAGNOSIS — I10 Essential (primary) hypertension: Secondary | ICD-10-CM

## 2016-02-14 DIAGNOSIS — E109 Type 1 diabetes mellitus without complications: Secondary | ICD-10-CM | POA: Diagnosis not present

## 2016-03-07 DIAGNOSIS — E039 Hypothyroidism, unspecified: Secondary | ICD-10-CM | POA: Diagnosis not present

## 2016-03-17 DIAGNOSIS — M1000 Idiopathic gout, unspecified site: Secondary | ICD-10-CM | POA: Diagnosis not present

## 2016-04-04 DIAGNOSIS — E109 Type 1 diabetes mellitus without complications: Secondary | ICD-10-CM | POA: Diagnosis not present

## 2016-04-19 ENCOUNTER — Ambulatory Visit (INDEPENDENT_AMBULATORY_CARE_PROVIDER_SITE_OTHER): Payer: 59 | Admitting: Ophthalmology

## 2016-04-19 DIAGNOSIS — I10 Essential (primary) hypertension: Secondary | ICD-10-CM | POA: Diagnosis not present

## 2016-04-19 DIAGNOSIS — H35033 Hypertensive retinopathy, bilateral: Secondary | ICD-10-CM | POA: Diagnosis not present

## 2016-04-19 DIAGNOSIS — E11319 Type 2 diabetes mellitus with unspecified diabetic retinopathy without macular edema: Secondary | ICD-10-CM

## 2016-04-19 DIAGNOSIS — H26493 Other secondary cataract, bilateral: Secondary | ICD-10-CM

## 2016-04-19 DIAGNOSIS — H43811 Vitreous degeneration, right eye: Secondary | ICD-10-CM

## 2016-04-19 DIAGNOSIS — E113593 Type 2 diabetes mellitus with proliferative diabetic retinopathy without macular edema, bilateral: Secondary | ICD-10-CM

## 2016-04-19 DIAGNOSIS — H35372 Puckering of macula, left eye: Secondary | ICD-10-CM

## 2016-04-28 DIAGNOSIS — E7800 Pure hypercholesterolemia, unspecified: Secondary | ICD-10-CM | POA: Diagnosis not present

## 2016-04-28 DIAGNOSIS — I10 Essential (primary) hypertension: Secondary | ICD-10-CM | POA: Diagnosis not present

## 2016-04-28 DIAGNOSIS — E109 Type 1 diabetes mellitus without complications: Secondary | ICD-10-CM | POA: Diagnosis not present

## 2016-05-17 ENCOUNTER — Other Ambulatory Visit (INDEPENDENT_AMBULATORY_CARE_PROVIDER_SITE_OTHER): Payer: 59 | Admitting: Ophthalmology

## 2016-05-17 DIAGNOSIS — H26492 Other secondary cataract, left eye: Secondary | ICD-10-CM | POA: Diagnosis not present

## 2016-05-19 DIAGNOSIS — E109 Type 1 diabetes mellitus without complications: Secondary | ICD-10-CM | POA: Diagnosis not present

## 2016-05-22 DIAGNOSIS — E782 Mixed hyperlipidemia: Secondary | ICD-10-CM | POA: Diagnosis not present

## 2016-05-22 DIAGNOSIS — E119 Type 2 diabetes mellitus without complications: Secondary | ICD-10-CM | POA: Diagnosis not present

## 2016-05-22 DIAGNOSIS — Z1389 Encounter for screening for other disorder: Secondary | ICD-10-CM | POA: Diagnosis not present

## 2016-05-22 DIAGNOSIS — E109 Type 1 diabetes mellitus without complications: Secondary | ICD-10-CM | POA: Diagnosis not present

## 2016-05-31 ENCOUNTER — Ambulatory Visit (INDEPENDENT_AMBULATORY_CARE_PROVIDER_SITE_OTHER): Payer: 59 | Admitting: Ophthalmology

## 2016-05-31 DIAGNOSIS — H2702 Aphakia, left eye: Secondary | ICD-10-CM

## 2016-06-27 DIAGNOSIS — E109 Type 1 diabetes mellitus without complications: Secondary | ICD-10-CM | POA: Diagnosis not present

## 2016-07-06 ENCOUNTER — Ambulatory Visit (HOSPITAL_COMMUNITY)
Admission: RE | Admit: 2016-07-06 | Discharge: 2016-07-06 | Disposition: A | Discharge status: Home / Self Care | Payer: 59 | Source: Ambulatory Visit | Attending: Family Medicine | Admitting: Family Medicine

## 2016-07-06 ENCOUNTER — Other Ambulatory Visit (HOSPITAL_COMMUNITY): Payer: Self-pay | Admitting: Family Medicine

## 2016-07-06 DIAGNOSIS — M25532 Pain in left wrist: Secondary | ICD-10-CM

## 2016-07-06 DIAGNOSIS — E782 Mixed hyperlipidemia: Secondary | ICD-10-CM | POA: Diagnosis not present

## 2016-07-06 DIAGNOSIS — I10 Essential (primary) hypertension: Secondary | ICD-10-CM | POA: Diagnosis not present

## 2016-07-06 DIAGNOSIS — E109 Type 1 diabetes mellitus without complications: Secondary | ICD-10-CM | POA: Diagnosis not present

## 2016-07-25 DIAGNOSIS — M25532 Pain in left wrist: Secondary | ICD-10-CM | POA: Diagnosis not present

## 2016-07-25 DIAGNOSIS — G8929 Other chronic pain: Secondary | ICD-10-CM | POA: Diagnosis not present

## 2016-08-31 IMAGING — CR DG WRIST COMPLETE 3+V*L*
2 series · 2 of 2 positions shown · non-contrast
Comparison: None.

CLINICAL DATA: Wrist pain.  He is a plumber.

EXAM:
LEFT WRIST - COMPLETE 3+ VIEW

[view not recorded (1 of 2)]
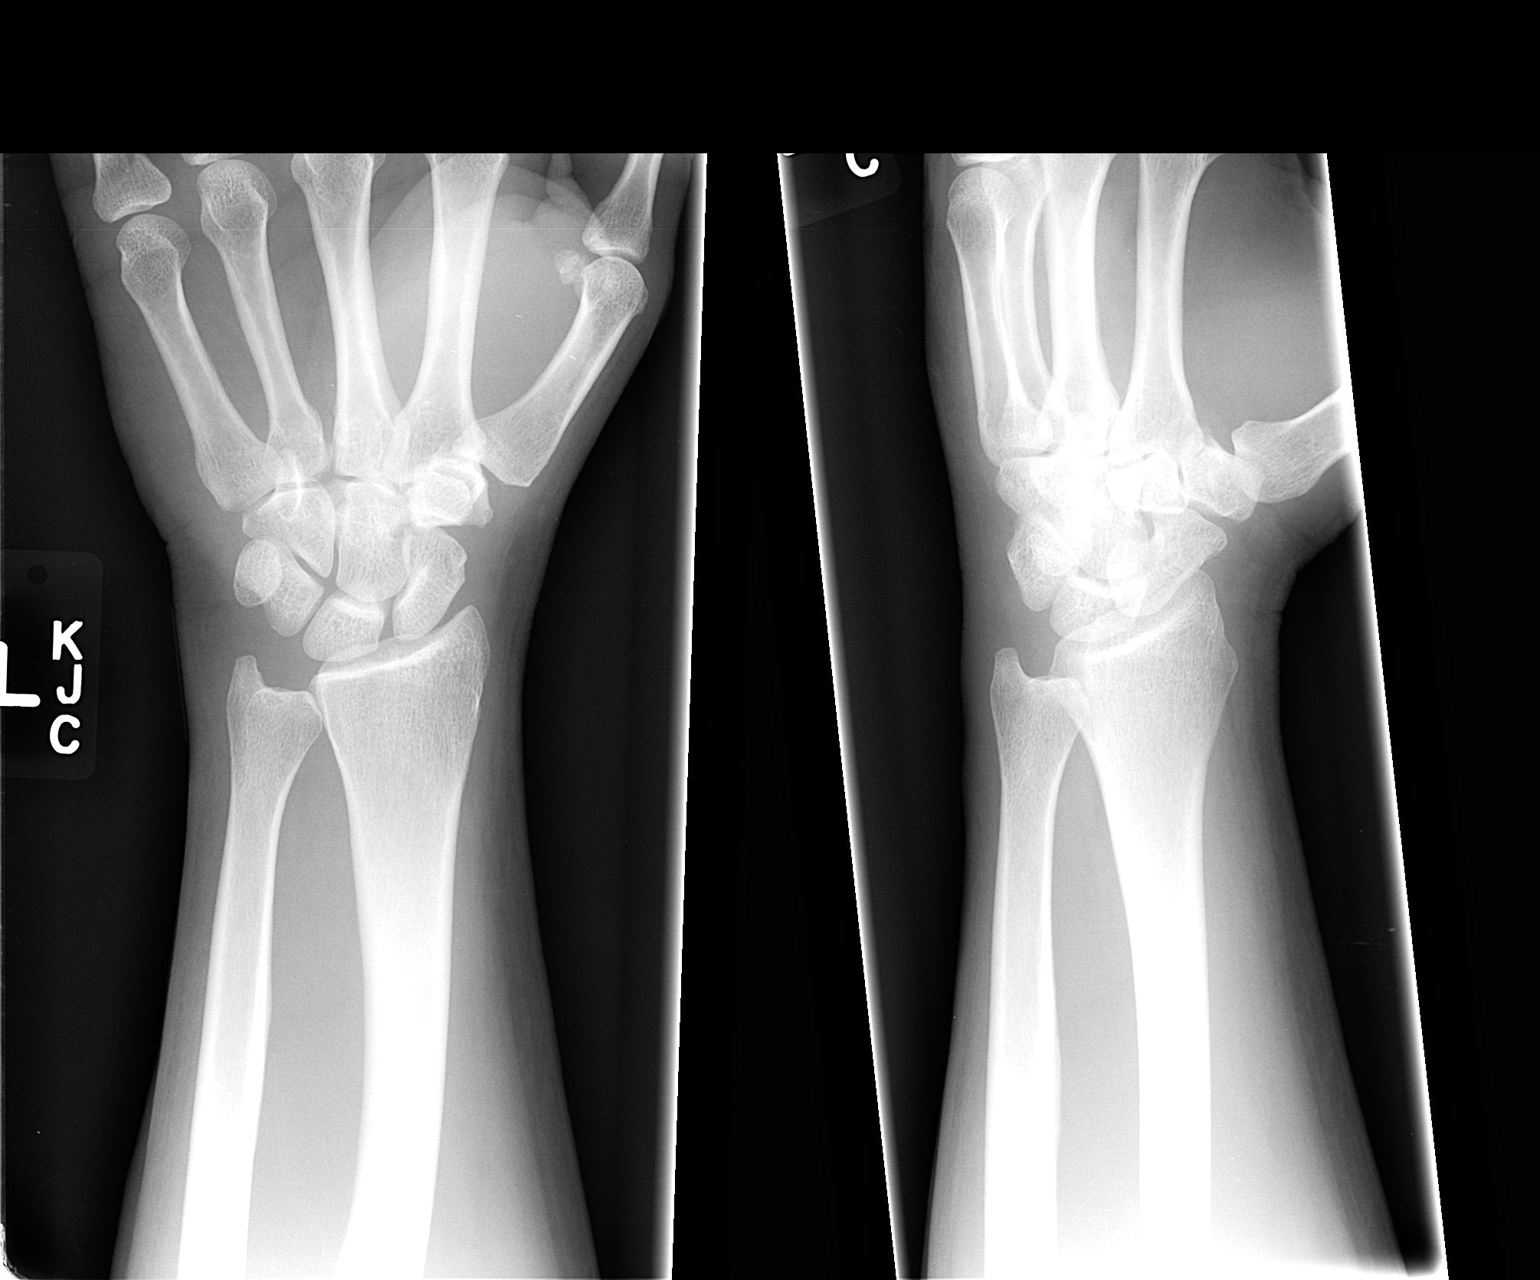

[view not recorded (2 of 2)]
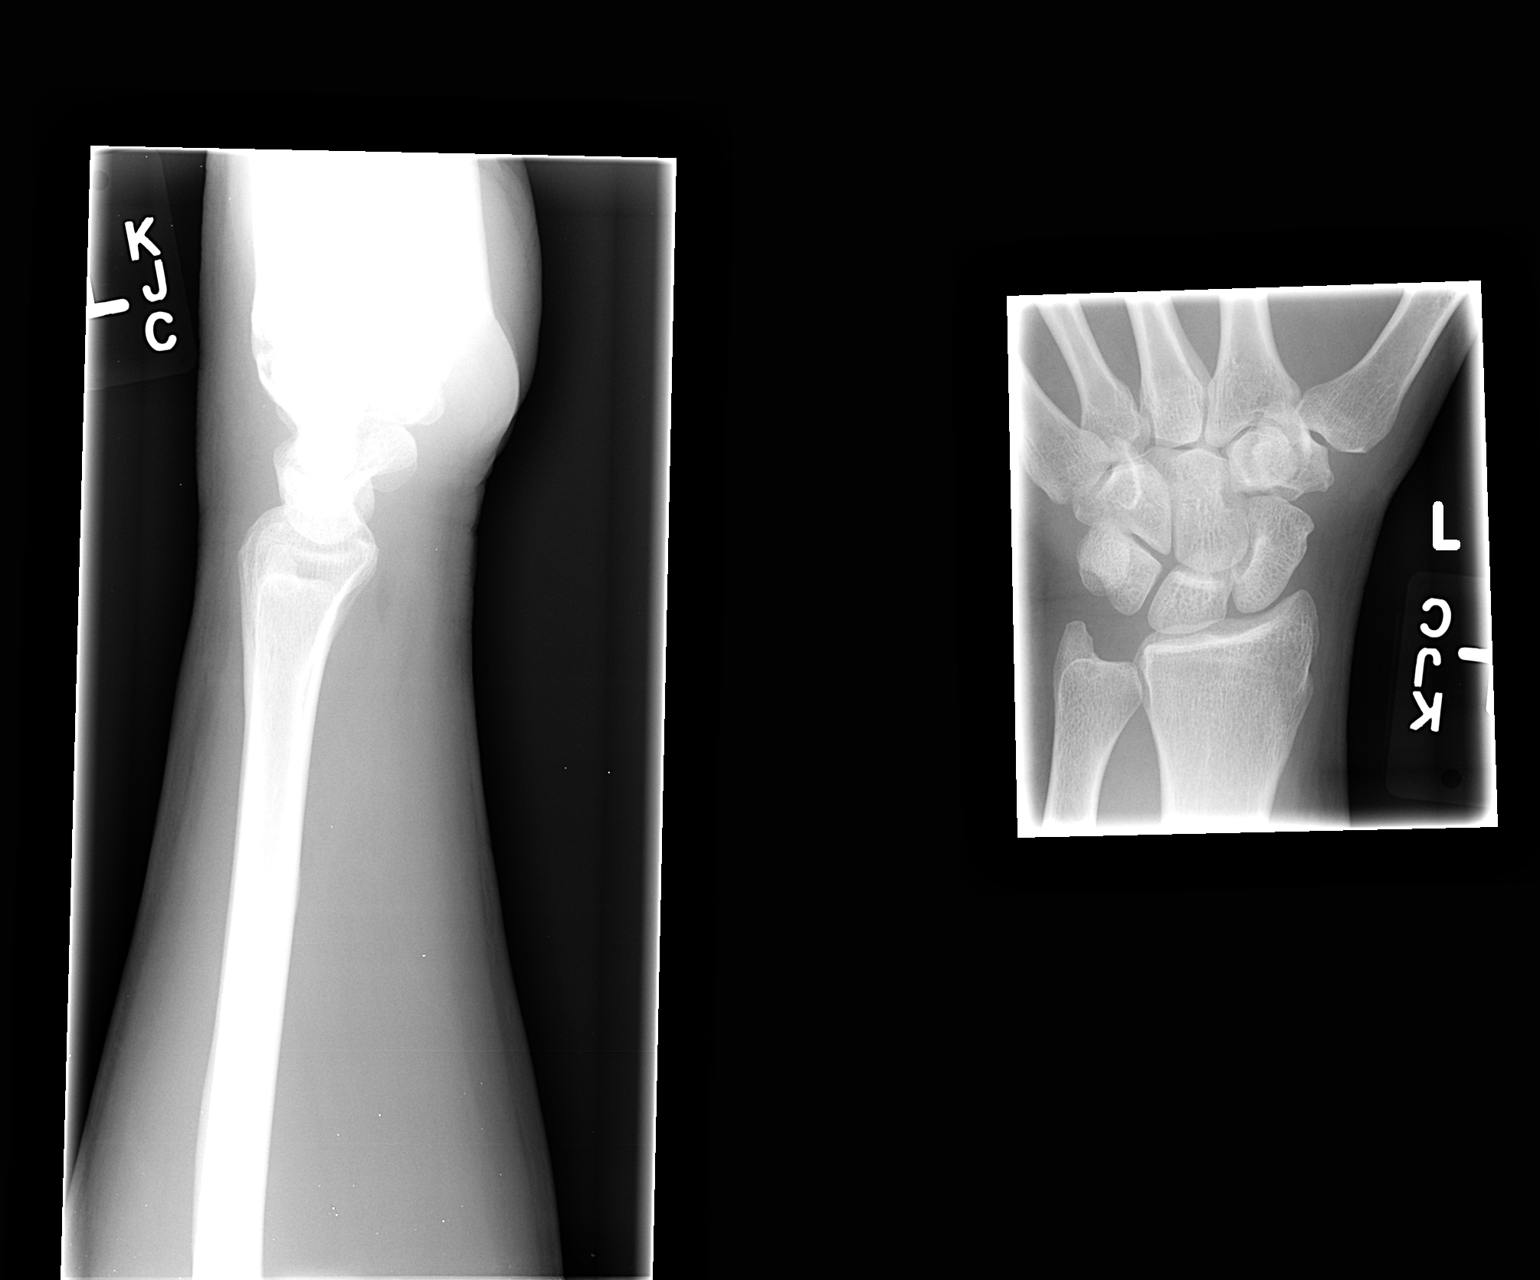

[2 of 2 positions shown; findings below may reference images not displayed]

FINDINGS: There is no evidence of fracture or dislocation. There is no
evidence of arthropathy or other focal bone abnormality. Soft
tissues are unremarkable.
IMPRESSION: Negative.

## 2016-09-06 DIAGNOSIS — E109 Type 1 diabetes mellitus without complications: Secondary | ICD-10-CM | POA: Diagnosis not present

## 2016-10-31 DIAGNOSIS — E109 Type 1 diabetes mellitus without complications: Secondary | ICD-10-CM | POA: Diagnosis not present

## 2016-10-31 DIAGNOSIS — Z23 Encounter for immunization: Secondary | ICD-10-CM | POA: Diagnosis not present

## 2016-10-31 DIAGNOSIS — I10 Essential (primary) hypertension: Secondary | ICD-10-CM | POA: Diagnosis not present

## 2016-10-31 DIAGNOSIS — E7800 Pure hypercholesterolemia, unspecified: Secondary | ICD-10-CM | POA: Diagnosis not present

## 2016-12-06 DIAGNOSIS — E109 Type 1 diabetes mellitus without complications: Secondary | ICD-10-CM | POA: Diagnosis not present

## 2016-12-07 ENCOUNTER — Ambulatory Visit (INDEPENDENT_AMBULATORY_CARE_PROVIDER_SITE_OTHER): Payer: 59 | Admitting: Ophthalmology

## 2016-12-07 DIAGNOSIS — H35033 Hypertensive retinopathy, bilateral: Secondary | ICD-10-CM | POA: Diagnosis not present

## 2016-12-07 DIAGNOSIS — E103512 Type 1 diabetes mellitus with proliferative diabetic retinopathy with macular edema, left eye: Secondary | ICD-10-CM

## 2016-12-07 DIAGNOSIS — E103591 Type 1 diabetes mellitus with proliferative diabetic retinopathy without macular edema, right eye: Secondary | ICD-10-CM

## 2016-12-07 DIAGNOSIS — H43811 Vitreous degeneration, right eye: Secondary | ICD-10-CM

## 2016-12-07 DIAGNOSIS — E10311 Type 1 diabetes mellitus with unspecified diabetic retinopathy with macular edema: Secondary | ICD-10-CM | POA: Diagnosis not present

## 2016-12-07 DIAGNOSIS — I10 Essential (primary) hypertension: Secondary | ICD-10-CM | POA: Diagnosis not present

## 2016-12-07 DIAGNOSIS — H35372 Puckering of macula, left eye: Secondary | ICD-10-CM | POA: Diagnosis not present

## 2017-01-10 DIAGNOSIS — R2689 Other abnormalities of gait and mobility: Secondary | ICD-10-CM | POA: Diagnosis not present

## 2017-01-10 DIAGNOSIS — M79672 Pain in left foot: Secondary | ICD-10-CM | POA: Diagnosis not present

## 2017-01-10 DIAGNOSIS — M76822 Posterior tibial tendinitis, left leg: Secondary | ICD-10-CM | POA: Diagnosis not present

## 2017-01-10 DIAGNOSIS — E139 Other specified diabetes mellitus without complications: Secondary | ICD-10-CM | POA: Diagnosis not present

## 2017-02-01 DIAGNOSIS — R2689 Other abnormalities of gait and mobility: Secondary | ICD-10-CM | POA: Diagnosis not present

## 2017-02-01 DIAGNOSIS — E139 Other specified diabetes mellitus without complications: Secondary | ICD-10-CM | POA: Diagnosis not present

## 2017-02-01 DIAGNOSIS — M76822 Posterior tibial tendinitis, left leg: Secondary | ICD-10-CM | POA: Diagnosis not present

## 2017-02-02 DIAGNOSIS — E039 Hypothyroidism, unspecified: Secondary | ICD-10-CM | POA: Diagnosis not present

## 2017-02-02 DIAGNOSIS — E109 Type 1 diabetes mellitus without complications: Secondary | ICD-10-CM | POA: Diagnosis not present

## 2017-02-02 DIAGNOSIS — E7800 Pure hypercholesterolemia, unspecified: Secondary | ICD-10-CM | POA: Diagnosis not present

## 2017-02-06 DIAGNOSIS — I10 Essential (primary) hypertension: Secondary | ICD-10-CM | POA: Diagnosis not present

## 2017-02-06 DIAGNOSIS — E7800 Pure hypercholesterolemia, unspecified: Secondary | ICD-10-CM | POA: Diagnosis not present

## 2017-02-06 DIAGNOSIS — E109 Type 1 diabetes mellitus without complications: Secondary | ICD-10-CM | POA: Diagnosis not present

## 2017-03-06 DIAGNOSIS — Z79899 Other long term (current) drug therapy: Secondary | ICD-10-CM | POA: Diagnosis not present

## 2017-03-07 DIAGNOSIS — R2689 Other abnormalities of gait and mobility: Secondary | ICD-10-CM | POA: Diagnosis not present

## 2017-03-07 DIAGNOSIS — B351 Tinea unguium: Secondary | ICD-10-CM | POA: Diagnosis not present

## 2017-03-07 DIAGNOSIS — E139 Other specified diabetes mellitus without complications: Secondary | ICD-10-CM | POA: Diagnosis not present

## 2017-04-03 DIAGNOSIS — Z79899 Other long term (current) drug therapy: Secondary | ICD-10-CM | POA: Diagnosis not present

## 2017-04-12 DIAGNOSIS — R2689 Other abnormalities of gait and mobility: Secondary | ICD-10-CM | POA: Diagnosis not present

## 2017-04-12 DIAGNOSIS — B351 Tinea unguium: Secondary | ICD-10-CM | POA: Diagnosis not present

## 2017-04-12 DIAGNOSIS — E139 Other specified diabetes mellitus without complications: Secondary | ICD-10-CM | POA: Diagnosis not present

## 2017-04-27 DIAGNOSIS — E109 Type 1 diabetes mellitus without complications: Secondary | ICD-10-CM | POA: Diagnosis not present

## 2017-04-29 DIAGNOSIS — E109 Type 1 diabetes mellitus without complications: Secondary | ICD-10-CM | POA: Diagnosis not present

## 2017-05-01 DIAGNOSIS — R05 Cough: Secondary | ICD-10-CM | POA: Diagnosis not present

## 2017-05-01 DIAGNOSIS — E6609 Other obesity due to excess calories: Secondary | ICD-10-CM | POA: Diagnosis not present

## 2017-05-01 DIAGNOSIS — J45901 Unspecified asthma with (acute) exacerbation: Secondary | ICD-10-CM | POA: Diagnosis not present

## 2017-05-30 DIAGNOSIS — E039 Hypothyroidism, unspecified: Secondary | ICD-10-CM | POA: Diagnosis not present

## 2017-05-30 DIAGNOSIS — E109 Type 1 diabetes mellitus without complications: Secondary | ICD-10-CM | POA: Diagnosis not present

## 2017-05-30 DIAGNOSIS — Z79899 Other long term (current) drug therapy: Secondary | ICD-10-CM | POA: Diagnosis not present

## 2017-06-07 ENCOUNTER — Encounter (INDEPENDENT_AMBULATORY_CARE_PROVIDER_SITE_OTHER): Payer: 59 | Admitting: Ophthalmology

## 2017-06-07 DIAGNOSIS — H35033 Hypertensive retinopathy, bilateral: Secondary | ICD-10-CM

## 2017-06-07 DIAGNOSIS — H43813 Vitreous degeneration, bilateral: Secondary | ICD-10-CM

## 2017-06-07 DIAGNOSIS — D649 Anemia, unspecified: Secondary | ICD-10-CM | POA: Diagnosis not present

## 2017-06-07 DIAGNOSIS — H35373 Puckering of macula, bilateral: Secondary | ICD-10-CM

## 2017-06-07 DIAGNOSIS — I10 Essential (primary) hypertension: Secondary | ICD-10-CM

## 2017-06-07 DIAGNOSIS — E139 Other specified diabetes mellitus without complications: Secondary | ICD-10-CM | POA: Diagnosis not present

## 2017-06-07 DIAGNOSIS — E10311 Type 1 diabetes mellitus with unspecified diabetic retinopathy with macular edema: Secondary | ICD-10-CM

## 2017-06-07 DIAGNOSIS — E7800 Pure hypercholesterolemia, unspecified: Secondary | ICD-10-CM | POA: Diagnosis not present

## 2017-06-07 DIAGNOSIS — E103593 Type 1 diabetes mellitus with proliferative diabetic retinopathy without macular edema, bilateral: Secondary | ICD-10-CM | POA: Diagnosis not present

## 2017-06-07 DIAGNOSIS — E109 Type 1 diabetes mellitus without complications: Secondary | ICD-10-CM | POA: Diagnosis not present

## 2017-06-07 DIAGNOSIS — B351 Tinea unguium: Secondary | ICD-10-CM | POA: Diagnosis not present

## 2017-08-13 DIAGNOSIS — E109 Type 1 diabetes mellitus without complications: Secondary | ICD-10-CM | POA: Diagnosis not present

## 2017-10-29 DIAGNOSIS — M25532 Pain in left wrist: Secondary | ICD-10-CM | POA: Diagnosis not present

## 2017-10-29 DIAGNOSIS — G8929 Other chronic pain: Secondary | ICD-10-CM | POA: Diagnosis not present

## 2017-10-29 DIAGNOSIS — M109 Gout, unspecified: Secondary | ICD-10-CM | POA: Diagnosis not present

## 2017-11-22 DIAGNOSIS — E109 Type 1 diabetes mellitus without complications: Secondary | ICD-10-CM | POA: Diagnosis not present

## 2017-11-27 DIAGNOSIS — G8929 Other chronic pain: Secondary | ICD-10-CM | POA: Diagnosis not present

## 2017-11-27 DIAGNOSIS — M25532 Pain in left wrist: Secondary | ICD-10-CM | POA: Diagnosis not present

## 2017-12-03 DIAGNOSIS — E039 Hypothyroidism, unspecified: Secondary | ICD-10-CM | POA: Diagnosis not present

## 2017-12-03 DIAGNOSIS — E7800 Pure hypercholesterolemia, unspecified: Secondary | ICD-10-CM | POA: Diagnosis not present

## 2017-12-03 DIAGNOSIS — E109 Type 1 diabetes mellitus without complications: Secondary | ICD-10-CM | POA: Diagnosis not present

## 2017-12-03 DIAGNOSIS — I10 Essential (primary) hypertension: Secondary | ICD-10-CM | POA: Diagnosis not present

## 2017-12-11 ENCOUNTER — Encounter (INDEPENDENT_AMBULATORY_CARE_PROVIDER_SITE_OTHER): Payer: 59 | Admitting: Ophthalmology

## 2017-12-11 DIAGNOSIS — I10 Essential (primary) hypertension: Secondary | ICD-10-CM

## 2017-12-11 DIAGNOSIS — E103592 Type 1 diabetes mellitus with proliferative diabetic retinopathy without macular edema, left eye: Secondary | ICD-10-CM

## 2017-12-11 DIAGNOSIS — H43813 Vitreous degeneration, bilateral: Secondary | ICD-10-CM

## 2017-12-11 DIAGNOSIS — E103511 Type 1 diabetes mellitus with proliferative diabetic retinopathy with macular edema, right eye: Secondary | ICD-10-CM

## 2017-12-11 DIAGNOSIS — E10311 Type 1 diabetes mellitus with unspecified diabetic retinopathy with macular edema: Secondary | ICD-10-CM

## 2017-12-11 DIAGNOSIS — H35033 Hypertensive retinopathy, bilateral: Secondary | ICD-10-CM

## 2017-12-17 ENCOUNTER — Encounter (INDEPENDENT_AMBULATORY_CARE_PROVIDER_SITE_OTHER): Payer: 59 | Admitting: Ophthalmology

## 2017-12-17 DIAGNOSIS — E113511 Type 2 diabetes mellitus with proliferative diabetic retinopathy with macular edema, right eye: Secondary | ICD-10-CM

## 2017-12-17 DIAGNOSIS — E11311 Type 2 diabetes mellitus with unspecified diabetic retinopathy with macular edema: Secondary | ICD-10-CM | POA: Diagnosis not present

## 2018-01-03 DIAGNOSIS — L4050 Arthropathic psoriasis, unspecified: Secondary | ICD-10-CM | POA: Diagnosis not present

## 2018-01-03 DIAGNOSIS — M25571 Pain in right ankle and joints of right foot: Secondary | ICD-10-CM | POA: Diagnosis not present

## 2018-01-03 DIAGNOSIS — M1990 Unspecified osteoarthritis, unspecified site: Secondary | ICD-10-CM | POA: Diagnosis not present

## 2018-01-03 DIAGNOSIS — M79672 Pain in left foot: Secondary | ICD-10-CM | POA: Diagnosis not present

## 2018-01-03 DIAGNOSIS — M1009 Idiopathic gout, multiple sites: Secondary | ICD-10-CM | POA: Diagnosis not present

## 2018-01-03 DIAGNOSIS — M19071 Primary osteoarthritis, right ankle and foot: Secondary | ICD-10-CM | POA: Diagnosis not present

## 2018-01-14 ENCOUNTER — Encounter (INDEPENDENT_AMBULATORY_CARE_PROVIDER_SITE_OTHER): Payer: 59 | Admitting: Ophthalmology

## 2018-01-14 DIAGNOSIS — E103592 Type 1 diabetes mellitus with proliferative diabetic retinopathy without macular edema, left eye: Secondary | ICD-10-CM

## 2018-01-14 DIAGNOSIS — H43813 Vitreous degeneration, bilateral: Secondary | ICD-10-CM

## 2018-01-14 DIAGNOSIS — I10 Essential (primary) hypertension: Secondary | ICD-10-CM

## 2018-01-14 DIAGNOSIS — E103511 Type 1 diabetes mellitus with proliferative diabetic retinopathy with macular edema, right eye: Secondary | ICD-10-CM | POA: Diagnosis not present

## 2018-01-14 DIAGNOSIS — H35372 Puckering of macula, left eye: Secondary | ICD-10-CM

## 2018-01-14 DIAGNOSIS — E10311 Type 1 diabetes mellitus with unspecified diabetic retinopathy with macular edema: Secondary | ICD-10-CM | POA: Diagnosis not present

## 2018-01-14 DIAGNOSIS — H35033 Hypertensive retinopathy, bilateral: Secondary | ICD-10-CM

## 2018-01-31 DIAGNOSIS — M1009 Idiopathic gout, multiple sites: Secondary | ICD-10-CM | POA: Diagnosis not present

## 2018-01-31 DIAGNOSIS — M1990 Unspecified osteoarthritis, unspecified site: Secondary | ICD-10-CM | POA: Diagnosis not present

## 2018-01-31 DIAGNOSIS — L4050 Arthropathic psoriasis, unspecified: Secondary | ICD-10-CM | POA: Diagnosis not present

## 2018-02-13 ENCOUNTER — Encounter (INDEPENDENT_AMBULATORY_CARE_PROVIDER_SITE_OTHER): Payer: 59 | Admitting: Ophthalmology

## 2018-02-13 DIAGNOSIS — H35033 Hypertensive retinopathy, bilateral: Secondary | ICD-10-CM

## 2018-02-13 DIAGNOSIS — E103511 Type 1 diabetes mellitus with proliferative diabetic retinopathy with macular edema, right eye: Secondary | ICD-10-CM

## 2018-02-13 DIAGNOSIS — E103592 Type 1 diabetes mellitus with proliferative diabetic retinopathy without macular edema, left eye: Secondary | ICD-10-CM | POA: Diagnosis not present

## 2018-02-13 DIAGNOSIS — I10 Essential (primary) hypertension: Secondary | ICD-10-CM | POA: Diagnosis not present

## 2018-02-13 DIAGNOSIS — H43813 Vitreous degeneration, bilateral: Secondary | ICD-10-CM

## 2018-02-13 DIAGNOSIS — E10311 Type 1 diabetes mellitus with unspecified diabetic retinopathy with macular edema: Secondary | ICD-10-CM | POA: Diagnosis not present

## 2018-02-13 DIAGNOSIS — H35372 Puckering of macula, left eye: Secondary | ICD-10-CM

## 2018-03-13 ENCOUNTER — Encounter (INDEPENDENT_AMBULATORY_CARE_PROVIDER_SITE_OTHER): Payer: 59 | Admitting: Ophthalmology

## 2018-03-13 DIAGNOSIS — I10 Essential (primary) hypertension: Secondary | ICD-10-CM | POA: Diagnosis not present

## 2018-03-13 DIAGNOSIS — E103511 Type 1 diabetes mellitus with proliferative diabetic retinopathy with macular edema, right eye: Secondary | ICD-10-CM

## 2018-03-13 DIAGNOSIS — E10311 Type 1 diabetes mellitus with unspecified diabetic retinopathy with macular edema: Secondary | ICD-10-CM | POA: Diagnosis not present

## 2018-03-13 DIAGNOSIS — H43813 Vitreous degeneration, bilateral: Secondary | ICD-10-CM

## 2018-03-13 DIAGNOSIS — H35033 Hypertensive retinopathy, bilateral: Secondary | ICD-10-CM

## 2018-03-13 DIAGNOSIS — H35372 Puckering of macula, left eye: Secondary | ICD-10-CM

## 2018-03-13 DIAGNOSIS — E103592 Type 1 diabetes mellitus with proliferative diabetic retinopathy without macular edema, left eye: Secondary | ICD-10-CM | POA: Diagnosis not present

## 2018-04-10 ENCOUNTER — Encounter (INDEPENDENT_AMBULATORY_CARE_PROVIDER_SITE_OTHER): Payer: 59 | Admitting: Ophthalmology

## 2018-04-10 ENCOUNTER — Other Ambulatory Visit: Payer: Self-pay

## 2018-04-10 DIAGNOSIS — I10 Essential (primary) hypertension: Secondary | ICD-10-CM | POA: Diagnosis not present

## 2018-04-10 DIAGNOSIS — H35033 Hypertensive retinopathy, bilateral: Secondary | ICD-10-CM

## 2018-04-10 DIAGNOSIS — E103513 Type 1 diabetes mellitus with proliferative diabetic retinopathy with macular edema, bilateral: Secondary | ICD-10-CM | POA: Diagnosis not present

## 2018-04-10 DIAGNOSIS — E10311 Type 1 diabetes mellitus with unspecified diabetic retinopathy with macular edema: Secondary | ICD-10-CM

## 2018-04-10 DIAGNOSIS — H35372 Puckering of macula, left eye: Secondary | ICD-10-CM

## 2018-04-10 DIAGNOSIS — H43813 Vitreous degeneration, bilateral: Secondary | ICD-10-CM

## 2018-04-30 DIAGNOSIS — M1009 Idiopathic gout, multiple sites: Secondary | ICD-10-CM | POA: Diagnosis not present

## 2018-04-30 DIAGNOSIS — M1990 Unspecified osteoarthritis, unspecified site: Secondary | ICD-10-CM | POA: Diagnosis not present

## 2018-04-30 DIAGNOSIS — L4050 Arthropathic psoriasis, unspecified: Secondary | ICD-10-CM | POA: Diagnosis not present

## 2018-05-15 DIAGNOSIS — Z681 Body mass index (BMI) 19 or less, adult: Secondary | ICD-10-CM | POA: Diagnosis not present

## 2018-05-15 DIAGNOSIS — J209 Acute bronchitis, unspecified: Secondary | ICD-10-CM | POA: Diagnosis not present

## 2018-05-23 ENCOUNTER — Encounter (INDEPENDENT_AMBULATORY_CARE_PROVIDER_SITE_OTHER): Payer: 59 | Admitting: Ophthalmology

## 2018-05-23 ENCOUNTER — Other Ambulatory Visit: Payer: Self-pay

## 2018-05-23 DIAGNOSIS — H43813 Vitreous degeneration, bilateral: Secondary | ICD-10-CM

## 2018-05-23 DIAGNOSIS — I10 Essential (primary) hypertension: Secondary | ICD-10-CM

## 2018-05-23 DIAGNOSIS — H35372 Puckering of macula, left eye: Secondary | ICD-10-CM

## 2018-05-23 DIAGNOSIS — E103592 Type 1 diabetes mellitus with proliferative diabetic retinopathy without macular edema, left eye: Secondary | ICD-10-CM | POA: Diagnosis not present

## 2018-05-23 DIAGNOSIS — H35033 Hypertensive retinopathy, bilateral: Secondary | ICD-10-CM

## 2018-05-23 DIAGNOSIS — E103511 Type 1 diabetes mellitus with proliferative diabetic retinopathy with macular edema, right eye: Secondary | ICD-10-CM

## 2018-05-23 DIAGNOSIS — E10311 Type 1 diabetes mellitus with unspecified diabetic retinopathy with macular edema: Secondary | ICD-10-CM | POA: Diagnosis not present

## 2018-07-03 ENCOUNTER — Other Ambulatory Visit: Payer: Self-pay

## 2018-07-03 ENCOUNTER — Encounter (INDEPENDENT_AMBULATORY_CARE_PROVIDER_SITE_OTHER): Payer: 59 | Admitting: Ophthalmology

## 2018-07-03 DIAGNOSIS — E10311 Type 1 diabetes mellitus with unspecified diabetic retinopathy with macular edema: Secondary | ICD-10-CM | POA: Diagnosis not present

## 2018-07-03 DIAGNOSIS — H35033 Hypertensive retinopathy, bilateral: Secondary | ICD-10-CM

## 2018-07-03 DIAGNOSIS — I10 Essential (primary) hypertension: Secondary | ICD-10-CM

## 2018-07-03 DIAGNOSIS — E103592 Type 1 diabetes mellitus with proliferative diabetic retinopathy without macular edema, left eye: Secondary | ICD-10-CM | POA: Diagnosis not present

## 2018-07-03 DIAGNOSIS — E103511 Type 1 diabetes mellitus with proliferative diabetic retinopathy with macular edema, right eye: Secondary | ICD-10-CM | POA: Diagnosis not present

## 2018-08-15 ENCOUNTER — Encounter (INDEPENDENT_AMBULATORY_CARE_PROVIDER_SITE_OTHER): Payer: 59 | Admitting: Ophthalmology

## 2018-08-15 ENCOUNTER — Other Ambulatory Visit: Payer: Self-pay

## 2018-08-15 DIAGNOSIS — E11311 Type 2 diabetes mellitus with unspecified diabetic retinopathy with macular edema: Secondary | ICD-10-CM

## 2018-08-15 DIAGNOSIS — I10 Essential (primary) hypertension: Secondary | ICD-10-CM

## 2018-08-15 DIAGNOSIS — E113511 Type 2 diabetes mellitus with proliferative diabetic retinopathy with macular edema, right eye: Secondary | ICD-10-CM

## 2018-08-15 DIAGNOSIS — E113592 Type 2 diabetes mellitus with proliferative diabetic retinopathy without macular edema, left eye: Secondary | ICD-10-CM | POA: Diagnosis not present

## 2018-08-15 DIAGNOSIS — H35033 Hypertensive retinopathy, bilateral: Secondary | ICD-10-CM

## 2018-08-15 DIAGNOSIS — H43813 Vitreous degeneration, bilateral: Secondary | ICD-10-CM

## 2018-08-15 DIAGNOSIS — H35372 Puckering of macula, left eye: Secondary | ICD-10-CM

## 2018-10-04 ENCOUNTER — Other Ambulatory Visit: Payer: Self-pay

## 2018-10-04 ENCOUNTER — Encounter (INDEPENDENT_AMBULATORY_CARE_PROVIDER_SITE_OTHER): Payer: 59 | Admitting: Ophthalmology

## 2018-10-04 DIAGNOSIS — E10311 Type 1 diabetes mellitus with unspecified diabetic retinopathy with macular edema: Secondary | ICD-10-CM

## 2018-10-04 DIAGNOSIS — E103592 Type 1 diabetes mellitus with proliferative diabetic retinopathy without macular edema, left eye: Secondary | ICD-10-CM

## 2018-10-04 DIAGNOSIS — E103511 Type 1 diabetes mellitus with proliferative diabetic retinopathy with macular edema, right eye: Secondary | ICD-10-CM | POA: Diagnosis not present

## 2018-10-04 DIAGNOSIS — I10 Essential (primary) hypertension: Secondary | ICD-10-CM

## 2018-10-04 DIAGNOSIS — H35033 Hypertensive retinopathy, bilateral: Secondary | ICD-10-CM

## 2018-10-04 DIAGNOSIS — H35372 Puckering of macula, left eye: Secondary | ICD-10-CM

## 2018-10-04 DIAGNOSIS — H43813 Vitreous degeneration, bilateral: Secondary | ICD-10-CM

## 2018-11-21 ENCOUNTER — Encounter (INDEPENDENT_AMBULATORY_CARE_PROVIDER_SITE_OTHER): Payer: 59 | Admitting: Ophthalmology

## 2018-11-21 DIAGNOSIS — E103592 Type 1 diabetes mellitus with proliferative diabetic retinopathy without macular edema, left eye: Secondary | ICD-10-CM

## 2018-11-21 DIAGNOSIS — E103511 Type 1 diabetes mellitus with proliferative diabetic retinopathy with macular edema, right eye: Secondary | ICD-10-CM

## 2018-11-21 DIAGNOSIS — I10 Essential (primary) hypertension: Secondary | ICD-10-CM | POA: Diagnosis not present

## 2018-11-21 DIAGNOSIS — E10311 Type 1 diabetes mellitus with unspecified diabetic retinopathy with macular edema: Secondary | ICD-10-CM | POA: Diagnosis not present

## 2018-11-21 DIAGNOSIS — H35033 Hypertensive retinopathy, bilateral: Secondary | ICD-10-CM

## 2018-11-21 DIAGNOSIS — H43813 Vitreous degeneration, bilateral: Secondary | ICD-10-CM

## 2018-12-27 ENCOUNTER — Other Ambulatory Visit: Payer: Self-pay

## 2018-12-27 ENCOUNTER — Encounter (INDEPENDENT_AMBULATORY_CARE_PROVIDER_SITE_OTHER): Payer: 59 | Admitting: Ophthalmology

## 2018-12-27 DIAGNOSIS — E11311 Type 2 diabetes mellitus with unspecified diabetic retinopathy with macular edema: Secondary | ICD-10-CM

## 2018-12-27 DIAGNOSIS — H35372 Puckering of macula, left eye: Secondary | ICD-10-CM

## 2018-12-27 DIAGNOSIS — H35033 Hypertensive retinopathy, bilateral: Secondary | ICD-10-CM

## 2018-12-27 DIAGNOSIS — E113511 Type 2 diabetes mellitus with proliferative diabetic retinopathy with macular edema, right eye: Secondary | ICD-10-CM | POA: Diagnosis not present

## 2018-12-27 DIAGNOSIS — E113592 Type 2 diabetes mellitus with proliferative diabetic retinopathy without macular edema, left eye: Secondary | ICD-10-CM

## 2018-12-27 DIAGNOSIS — I10 Essential (primary) hypertension: Secondary | ICD-10-CM

## 2018-12-27 DIAGNOSIS — H43813 Vitreous degeneration, bilateral: Secondary | ICD-10-CM

## 2019-01-31 ENCOUNTER — Encounter (INDEPENDENT_AMBULATORY_CARE_PROVIDER_SITE_OTHER): Payer: 59 | Admitting: Ophthalmology

## 2019-01-31 DIAGNOSIS — H35033 Hypertensive retinopathy, bilateral: Secondary | ICD-10-CM

## 2019-01-31 DIAGNOSIS — E113592 Type 2 diabetes mellitus with proliferative diabetic retinopathy without macular edema, left eye: Secondary | ICD-10-CM | POA: Diagnosis not present

## 2019-01-31 DIAGNOSIS — I10 Essential (primary) hypertension: Secondary | ICD-10-CM

## 2019-01-31 DIAGNOSIS — H35372 Puckering of macula, left eye: Secondary | ICD-10-CM

## 2019-01-31 DIAGNOSIS — E103511 Type 1 diabetes mellitus with proliferative diabetic retinopathy with macular edema, right eye: Secondary | ICD-10-CM | POA: Diagnosis not present

## 2019-01-31 DIAGNOSIS — E10311 Type 1 diabetes mellitus with unspecified diabetic retinopathy with macular edema: Secondary | ICD-10-CM

## 2019-01-31 DIAGNOSIS — H43813 Vitreous degeneration, bilateral: Secondary | ICD-10-CM

## 2019-03-10 ENCOUNTER — Encounter (INDEPENDENT_AMBULATORY_CARE_PROVIDER_SITE_OTHER): Payer: 59 | Admitting: Ophthalmology

## 2019-03-11 ENCOUNTER — Other Ambulatory Visit: Payer: Self-pay

## 2019-03-11 ENCOUNTER — Encounter (INDEPENDENT_AMBULATORY_CARE_PROVIDER_SITE_OTHER): Payer: 59 | Admitting: Ophthalmology

## 2019-03-11 DIAGNOSIS — E10311 Type 1 diabetes mellitus with unspecified diabetic retinopathy with macular edema: Secondary | ICD-10-CM

## 2019-03-11 DIAGNOSIS — E103511 Type 1 diabetes mellitus with proliferative diabetic retinopathy with macular edema, right eye: Secondary | ICD-10-CM

## 2019-03-11 DIAGNOSIS — E103592 Type 1 diabetes mellitus with proliferative diabetic retinopathy without macular edema, left eye: Secondary | ICD-10-CM

## 2019-03-11 DIAGNOSIS — H35033 Hypertensive retinopathy, bilateral: Secondary | ICD-10-CM

## 2019-03-11 DIAGNOSIS — I10 Essential (primary) hypertension: Secondary | ICD-10-CM | POA: Diagnosis not present

## 2019-03-11 DIAGNOSIS — H43813 Vitreous degeneration, bilateral: Secondary | ICD-10-CM

## 2019-04-04 ENCOUNTER — Ambulatory Visit: Payer: 59 | Attending: Internal Medicine

## 2019-04-04 DIAGNOSIS — Z23 Encounter for immunization: Secondary | ICD-10-CM

## 2019-04-04 NOTE — Progress Notes (Signed)
   Covid-19 Vaccination Clinic  Name:  BRALEN WILTGEN    MRN: [id number] DOB: Oct 03, 1960  04/04/2019  Mr. Heckmann was observed post Covid-19 immunization for 15 minutes without incident. He was provided with Vaccine Information Sheet and instruction to access the V-Safe system.   Mr. Torr was instructed to call 911 with any severe reactions post vaccine: Marland Kitchen Difficulty breathing  . Swelling of face and throat  . A fast heartbeat  . A bad rash all over body  . Dizziness and weakness   Immunizations Administered    Name Date Dose VIS Date Route   Moderna COVID-19 Vaccine 04/04/2019  8:18 AM 0.5 mL 12/10/2018 Intramuscular   Manufacturer: Moderna   Lot: [id number]   NDC: 01093-235-57

## 2019-04-22 ENCOUNTER — Encounter (INDEPENDENT_AMBULATORY_CARE_PROVIDER_SITE_OTHER): Payer: 59 | Admitting: Ophthalmology

## 2019-04-22 DIAGNOSIS — E113511 Type 2 diabetes mellitus with proliferative diabetic retinopathy with macular edema, right eye: Secondary | ICD-10-CM | POA: Diagnosis not present

## 2019-04-22 DIAGNOSIS — I10 Essential (primary) hypertension: Secondary | ICD-10-CM

## 2019-04-22 DIAGNOSIS — E113592 Type 2 diabetes mellitus with proliferative diabetic retinopathy without macular edema, left eye: Secondary | ICD-10-CM

## 2019-04-22 DIAGNOSIS — E11311 Type 2 diabetes mellitus with unspecified diabetic retinopathy with macular edema: Secondary | ICD-10-CM

## 2019-04-22 DIAGNOSIS — H35373 Puckering of macula, bilateral: Secondary | ICD-10-CM

## 2019-04-22 DIAGNOSIS — H43813 Vitreous degeneration, bilateral: Secondary | ICD-10-CM

## 2019-04-22 DIAGNOSIS — H35033 Hypertensive retinopathy, bilateral: Secondary | ICD-10-CM

## 2019-05-07 ENCOUNTER — Ambulatory Visit: Payer: 59 | Attending: Internal Medicine

## 2019-05-07 DIAGNOSIS — Z23 Encounter for immunization: Secondary | ICD-10-CM

## 2019-05-07 NOTE — Progress Notes (Signed)
   Covid-19 Vaccination Clinic  Name:  GRIER VU    MRN: [id number] DOB: 1960-12-04  05/07/2019  Mr. Vallin was observed post Covid-19 immunization for 15 minutes without incident. He was provided with Vaccine Information Sheet and instruction to access the V-Safe system.   Mr. Aigner was instructed to call 911 with any severe reactions post vaccine: Marland Kitchen Difficulty breathing  . Swelling of face and throat  . A fast heartbeat  . A bad rash all over body  . Dizziness and weakness   Immunizations Administered    Name Date Dose VIS Date Route   Moderna COVID-19 Vaccine 05/07/2019 10:50 AM 0.5 mL 12/2018 Intramuscular   Manufacturer: Moderna   Lot: [id number]   NDC: 03403-524-81

## 2019-06-03 ENCOUNTER — Other Ambulatory Visit: Payer: Self-pay

## 2019-06-03 ENCOUNTER — Encounter (INDEPENDENT_AMBULATORY_CARE_PROVIDER_SITE_OTHER): Payer: 59 | Admitting: Ophthalmology

## 2019-06-03 DIAGNOSIS — H35033 Hypertensive retinopathy, bilateral: Secondary | ICD-10-CM

## 2019-06-03 DIAGNOSIS — H43813 Vitreous degeneration, bilateral: Secondary | ICD-10-CM

## 2019-06-03 DIAGNOSIS — E113592 Type 2 diabetes mellitus with proliferative diabetic retinopathy without macular edema, left eye: Secondary | ICD-10-CM | POA: Diagnosis not present

## 2019-06-03 DIAGNOSIS — E113511 Type 2 diabetes mellitus with proliferative diabetic retinopathy with macular edema, right eye: Secondary | ICD-10-CM

## 2019-06-03 DIAGNOSIS — E11311 Type 2 diabetes mellitus with unspecified diabetic retinopathy with macular edema: Secondary | ICD-10-CM

## 2019-06-03 DIAGNOSIS — I10 Essential (primary) hypertension: Secondary | ICD-10-CM | POA: Diagnosis not present

## 2019-06-24 IMAGING — DX DG WRIST 2V*L*
2 series · 2 of 2 positions shown · non-contrast
Comparison: 09/13/2013

CLINICAL DATA: Left wrist pain.

EXAM:
LEFT WRIST - 2 VIEW

[x wrist pa left]
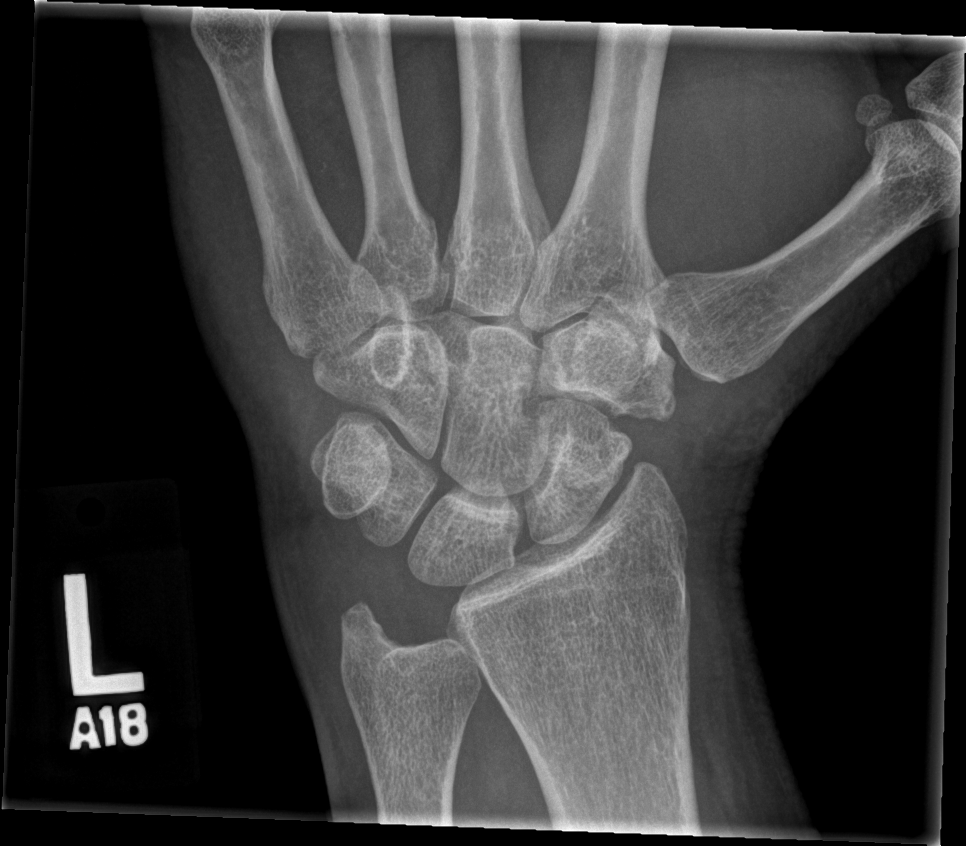

[x wrist lat left]
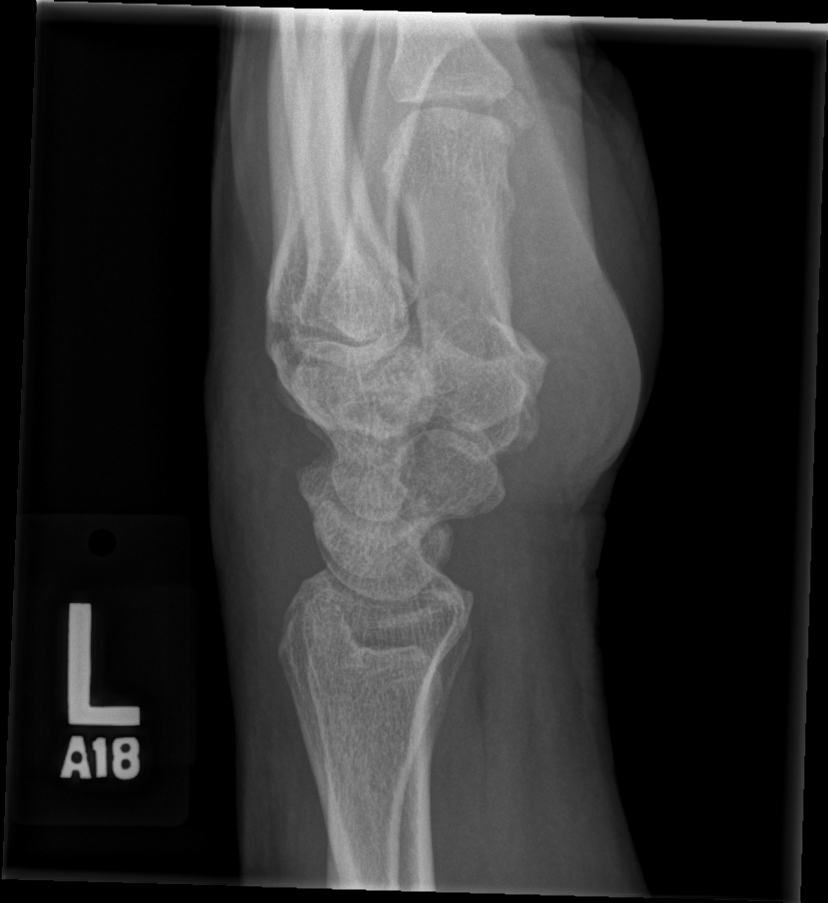

[2 of 2 positions shown; findings below may reference images not displayed]

FINDINGS: Two view exam the left wrist shows no fracture. No subluxation or
dislocation. No punched-out bony lesions characteristic of gout. No
worrisome lytic or sclerotic osseous abnormality.
IMPRESSION: Negative.

## 2019-07-24 ENCOUNTER — Encounter (INDEPENDENT_AMBULATORY_CARE_PROVIDER_SITE_OTHER): Payer: 59 | Admitting: Ophthalmology

## 2019-07-24 ENCOUNTER — Other Ambulatory Visit: Payer: Self-pay

## 2019-07-24 DIAGNOSIS — I10 Essential (primary) hypertension: Secondary | ICD-10-CM | POA: Diagnosis not present

## 2019-07-24 DIAGNOSIS — H35371 Puckering of macula, right eye: Secondary | ICD-10-CM

## 2019-07-24 DIAGNOSIS — E103511 Type 1 diabetes mellitus with proliferative diabetic retinopathy with macular edema, right eye: Secondary | ICD-10-CM | POA: Diagnosis not present

## 2019-07-24 DIAGNOSIS — E10311 Type 1 diabetes mellitus with unspecified diabetic retinopathy with macular edema: Secondary | ICD-10-CM | POA: Diagnosis not present

## 2019-07-24 DIAGNOSIS — E103592 Type 1 diabetes mellitus with proliferative diabetic retinopathy without macular edema, left eye: Secondary | ICD-10-CM

## 2019-07-24 DIAGNOSIS — H35033 Hypertensive retinopathy, bilateral: Secondary | ICD-10-CM

## 2019-07-24 DIAGNOSIS — H43813 Vitreous degeneration, bilateral: Secondary | ICD-10-CM

## 2019-09-18 ENCOUNTER — Other Ambulatory Visit: Payer: Self-pay

## 2019-09-18 ENCOUNTER — Encounter (INDEPENDENT_AMBULATORY_CARE_PROVIDER_SITE_OTHER): Payer: 59 | Admitting: Ophthalmology

## 2019-09-18 DIAGNOSIS — H35372 Puckering of macula, left eye: Secondary | ICD-10-CM

## 2019-09-18 DIAGNOSIS — E103511 Type 1 diabetes mellitus with proliferative diabetic retinopathy with macular edema, right eye: Secondary | ICD-10-CM

## 2019-09-18 DIAGNOSIS — E10311 Type 1 diabetes mellitus with unspecified diabetic retinopathy with macular edema: Secondary | ICD-10-CM

## 2019-09-18 DIAGNOSIS — E103592 Type 1 diabetes mellitus with proliferative diabetic retinopathy without macular edema, left eye: Secondary | ICD-10-CM | POA: Diagnosis not present

## 2019-09-18 DIAGNOSIS — H43813 Vitreous degeneration, bilateral: Secondary | ICD-10-CM

## 2019-09-18 DIAGNOSIS — I10 Essential (primary) hypertension: Secondary | ICD-10-CM

## 2019-09-18 DIAGNOSIS — H35033 Hypertensive retinopathy, bilateral: Secondary | ICD-10-CM

## 2019-11-13 ENCOUNTER — Other Ambulatory Visit: Payer: Self-pay

## 2019-11-13 ENCOUNTER — Encounter (INDEPENDENT_AMBULATORY_CARE_PROVIDER_SITE_OTHER): Payer: 59 | Admitting: Ophthalmology

## 2019-11-13 DIAGNOSIS — H35372 Puckering of macula, left eye: Secondary | ICD-10-CM

## 2019-11-13 DIAGNOSIS — H35033 Hypertensive retinopathy, bilateral: Secondary | ICD-10-CM

## 2019-11-13 DIAGNOSIS — E103511 Type 1 diabetes mellitus with proliferative diabetic retinopathy with macular edema, right eye: Secondary | ICD-10-CM

## 2019-11-13 DIAGNOSIS — I10 Essential (primary) hypertension: Secondary | ICD-10-CM

## 2019-11-13 DIAGNOSIS — E10311 Type 1 diabetes mellitus with unspecified diabetic retinopathy with macular edema: Secondary | ICD-10-CM

## 2019-11-13 DIAGNOSIS — H43813 Vitreous degeneration, bilateral: Secondary | ICD-10-CM

## 2019-11-13 DIAGNOSIS — E103592 Type 1 diabetes mellitus with proliferative diabetic retinopathy without macular edema, left eye: Secondary | ICD-10-CM | POA: Diagnosis not present

## 2020-01-27 ENCOUNTER — Encounter (INDEPENDENT_AMBULATORY_CARE_PROVIDER_SITE_OTHER): Payer: 59 | Admitting: Ophthalmology

## 2020-01-27 ENCOUNTER — Other Ambulatory Visit: Payer: Self-pay

## 2020-01-27 DIAGNOSIS — H35372 Puckering of macula, left eye: Secondary | ICD-10-CM

## 2020-01-27 DIAGNOSIS — H43813 Vitreous degeneration, bilateral: Secondary | ICD-10-CM

## 2020-01-27 DIAGNOSIS — I10 Essential (primary) hypertension: Secondary | ICD-10-CM

## 2020-01-27 DIAGNOSIS — E113592 Type 2 diabetes mellitus with proliferative diabetic retinopathy without macular edema, left eye: Secondary | ICD-10-CM | POA: Diagnosis not present

## 2020-01-27 DIAGNOSIS — E113511 Type 2 diabetes mellitus with proliferative diabetic retinopathy with macular edema, right eye: Secondary | ICD-10-CM | POA: Diagnosis not present

## 2020-01-27 DIAGNOSIS — H35033 Hypertensive retinopathy, bilateral: Secondary | ICD-10-CM | POA: Diagnosis not present

## 2020-01-28 ENCOUNTER — Encounter (INDEPENDENT_AMBULATORY_CARE_PROVIDER_SITE_OTHER): Payer: 59 | Admitting: Ophthalmology

## 2020-04-07 ENCOUNTER — Other Ambulatory Visit: Payer: Self-pay

## 2020-04-07 ENCOUNTER — Encounter (INDEPENDENT_AMBULATORY_CARE_PROVIDER_SITE_OTHER): Payer: 59 | Admitting: Ophthalmology

## 2020-04-07 DIAGNOSIS — E103592 Type 1 diabetes mellitus with proliferative diabetic retinopathy without macular edema, left eye: Secondary | ICD-10-CM

## 2020-04-07 DIAGNOSIS — I10 Essential (primary) hypertension: Secondary | ICD-10-CM

## 2020-04-07 DIAGNOSIS — H35033 Hypertensive retinopathy, bilateral: Secondary | ICD-10-CM | POA: Diagnosis not present

## 2020-04-07 DIAGNOSIS — E103511 Type 1 diabetes mellitus with proliferative diabetic retinopathy with macular edema, right eye: Secondary | ICD-10-CM

## 2020-04-07 DIAGNOSIS — H35372 Puckering of macula, left eye: Secondary | ICD-10-CM

## 2020-04-07 DIAGNOSIS — H43813 Vitreous degeneration, bilateral: Secondary | ICD-10-CM

## 2020-06-23 ENCOUNTER — Other Ambulatory Visit: Payer: Self-pay

## 2020-06-23 ENCOUNTER — Encounter (INDEPENDENT_AMBULATORY_CARE_PROVIDER_SITE_OTHER): Payer: 59 | Admitting: Ophthalmology

## 2020-06-23 DIAGNOSIS — E113511 Type 2 diabetes mellitus with proliferative diabetic retinopathy with macular edema, right eye: Secondary | ICD-10-CM

## 2020-06-23 DIAGNOSIS — I10 Essential (primary) hypertension: Secondary | ICD-10-CM

## 2020-06-23 DIAGNOSIS — H35033 Hypertensive retinopathy, bilateral: Secondary | ICD-10-CM

## 2020-06-23 DIAGNOSIS — E113592 Type 2 diabetes mellitus with proliferative diabetic retinopathy without macular edema, left eye: Secondary | ICD-10-CM

## 2020-06-23 DIAGNOSIS — H43813 Vitreous degeneration, bilateral: Secondary | ICD-10-CM

## 2020-09-15 ENCOUNTER — Other Ambulatory Visit: Payer: Self-pay

## 2020-09-15 ENCOUNTER — Encounter (INDEPENDENT_AMBULATORY_CARE_PROVIDER_SITE_OTHER): Payer: 59 | Admitting: Ophthalmology

## 2020-09-15 DIAGNOSIS — I10 Essential (primary) hypertension: Secondary | ICD-10-CM | POA: Diagnosis not present

## 2020-09-15 DIAGNOSIS — E113592 Type 2 diabetes mellitus with proliferative diabetic retinopathy without macular edema, left eye: Secondary | ICD-10-CM | POA: Diagnosis not present

## 2020-09-15 DIAGNOSIS — H35033 Hypertensive retinopathy, bilateral: Secondary | ICD-10-CM

## 2020-09-15 DIAGNOSIS — H35372 Puckering of macula, left eye: Secondary | ICD-10-CM

## 2020-09-15 DIAGNOSIS — E113511 Type 2 diabetes mellitus with proliferative diabetic retinopathy with macular edema, right eye: Secondary | ICD-10-CM | POA: Diagnosis not present

## 2020-09-15 DIAGNOSIS — H43813 Vitreous degeneration, bilateral: Secondary | ICD-10-CM

## 2020-12-08 ENCOUNTER — Encounter (INDEPENDENT_AMBULATORY_CARE_PROVIDER_SITE_OTHER): Payer: 59 | Admitting: Ophthalmology

## 2020-12-08 ENCOUNTER — Other Ambulatory Visit: Payer: Self-pay

## 2020-12-08 DIAGNOSIS — H43811 Vitreous degeneration, right eye: Secondary | ICD-10-CM

## 2020-12-08 DIAGNOSIS — I10 Essential (primary) hypertension: Secondary | ICD-10-CM

## 2020-12-08 DIAGNOSIS — E103592 Type 1 diabetes mellitus with proliferative diabetic retinopathy without macular edema, left eye: Secondary | ICD-10-CM

## 2020-12-08 DIAGNOSIS — E103511 Type 1 diabetes mellitus with proliferative diabetic retinopathy with macular edema, right eye: Secondary | ICD-10-CM | POA: Diagnosis not present

## 2020-12-08 DIAGNOSIS — H35033 Hypertensive retinopathy, bilateral: Secondary | ICD-10-CM

## 2021-03-02 ENCOUNTER — Encounter (INDEPENDENT_AMBULATORY_CARE_PROVIDER_SITE_OTHER): Payer: 59 | Admitting: Ophthalmology

## 2021-03-08 ENCOUNTER — Other Ambulatory Visit: Payer: Self-pay

## 2021-03-08 ENCOUNTER — Encounter (INDEPENDENT_AMBULATORY_CARE_PROVIDER_SITE_OTHER): Payer: 59 | Admitting: Ophthalmology

## 2021-03-08 DIAGNOSIS — H43812 Vitreous degeneration, left eye: Secondary | ICD-10-CM | POA: Diagnosis not present

## 2021-03-08 DIAGNOSIS — E103511 Type 1 diabetes mellitus with proliferative diabetic retinopathy with macular edema, right eye: Secondary | ICD-10-CM | POA: Diagnosis not present

## 2021-03-08 DIAGNOSIS — E103592 Type 1 diabetes mellitus with proliferative diabetic retinopathy without macular edema, left eye: Secondary | ICD-10-CM

## 2021-03-08 DIAGNOSIS — H35033 Hypertensive retinopathy, bilateral: Secondary | ICD-10-CM

## 2021-03-08 DIAGNOSIS — I10 Essential (primary) hypertension: Secondary | ICD-10-CM | POA: Diagnosis not present

## 2021-03-10 ENCOUNTER — Other Ambulatory Visit: Payer: Self-pay

## 2021-03-10 ENCOUNTER — Encounter (INDEPENDENT_AMBULATORY_CARE_PROVIDER_SITE_OTHER): Payer: 59 | Admitting: Ophthalmology

## 2021-03-10 DIAGNOSIS — H20011 Primary iridocyclitis, right eye: Secondary | ICD-10-CM

## 2021-03-10 DIAGNOSIS — E103511 Type 1 diabetes mellitus with proliferative diabetic retinopathy with macular edema, right eye: Secondary | ICD-10-CM

## 2021-03-11 ENCOUNTER — Encounter (INDEPENDENT_AMBULATORY_CARE_PROVIDER_SITE_OTHER): Payer: 59 | Admitting: Ophthalmology

## 2021-03-11 DIAGNOSIS — H3091 Unspecified chorioretinal inflammation, right eye: Secondary | ICD-10-CM

## 2021-03-12 ENCOUNTER — Other Ambulatory Visit (HOSPITAL_COMMUNITY)
Admit: 2021-03-12 | Discharge: 2021-03-12 | Disposition: A | Discharge status: Home / Self Care | Payer: 59 | Source: Ambulatory Visit | Attending: Ophthalmology | Admitting: Ophthalmology

## 2021-03-12 DIAGNOSIS — H44001 Unspecified purulent endophthalmitis, right eye: Secondary | ICD-10-CM | POA: Insufficient documentation

## 2021-03-14 ENCOUNTER — Other Ambulatory Visit: Payer: Self-pay

## 2021-03-14 ENCOUNTER — Encounter (INDEPENDENT_AMBULATORY_CARE_PROVIDER_SITE_OTHER): Payer: 59 | Admitting: Ophthalmology

## 2021-03-14 DIAGNOSIS — H4419 Other endophthalmitis: Secondary | ICD-10-CM

## 2021-03-15 LAB — AEROBIC CULTURE W GRAM STAIN (SUPERFICIAL SPECIMEN)
Culture: NO GROWTH
Gram Stain: NONE SEEN

## 2021-03-16 ENCOUNTER — Other Ambulatory Visit: Payer: Self-pay

## 2021-03-16 ENCOUNTER — Encounter (INDEPENDENT_AMBULATORY_CARE_PROVIDER_SITE_OTHER): Payer: 59 | Admitting: Ophthalmology

## 2021-03-16 DIAGNOSIS — H4419 Other endophthalmitis: Secondary | ICD-10-CM

## 2021-03-17 LAB — AEROBIC/ANAEROBIC CULTURE W GRAM STAIN (SURGICAL/DEEP WOUND): Culture: NO GROWTH

## 2021-03-18 ENCOUNTER — Other Ambulatory Visit: Payer: Self-pay

## 2021-03-18 ENCOUNTER — Encounter (INDEPENDENT_AMBULATORY_CARE_PROVIDER_SITE_OTHER): Payer: 59 | Admitting: Ophthalmology

## 2021-03-18 DIAGNOSIS — H4419 Other endophthalmitis: Secondary | ICD-10-CM

## 2021-03-22 ENCOUNTER — Encounter (INDEPENDENT_AMBULATORY_CARE_PROVIDER_SITE_OTHER): Payer: 59 | Admitting: Ophthalmology

## 2021-03-22 ENCOUNTER — Other Ambulatory Visit: Payer: Self-pay

## 2021-03-22 DIAGNOSIS — H4419 Other endophthalmitis: Secondary | ICD-10-CM

## 2021-03-28 ENCOUNTER — Other Ambulatory Visit: Payer: Self-pay

## 2021-03-28 ENCOUNTER — Encounter (INDEPENDENT_AMBULATORY_CARE_PROVIDER_SITE_OTHER): Payer: 59 | Admitting: Ophthalmology

## 2021-03-28 DIAGNOSIS — H4419 Other endophthalmitis: Secondary | ICD-10-CM

## 2021-03-31 ENCOUNTER — Encounter (INDEPENDENT_AMBULATORY_CARE_PROVIDER_SITE_OTHER): Payer: 59 | Admitting: Ophthalmology

## 2021-03-31 ENCOUNTER — Other Ambulatory Visit: Payer: Self-pay

## 2021-03-31 DIAGNOSIS — H4419 Other endophthalmitis: Secondary | ICD-10-CM

## 2021-04-08 ENCOUNTER — Encounter (INDEPENDENT_AMBULATORY_CARE_PROVIDER_SITE_OTHER): Payer: 59 | Admitting: Ophthalmology

## 2021-04-08 DIAGNOSIS — H43813 Vitreous degeneration, bilateral: Secondary | ICD-10-CM

## 2021-04-08 DIAGNOSIS — H35033 Hypertensive retinopathy, bilateral: Secondary | ICD-10-CM | POA: Diagnosis not present

## 2021-04-08 DIAGNOSIS — E103511 Type 1 diabetes mellitus with proliferative diabetic retinopathy with macular edema, right eye: Secondary | ICD-10-CM

## 2021-04-08 DIAGNOSIS — E103592 Type 1 diabetes mellitus with proliferative diabetic retinopathy without macular edema, left eye: Secondary | ICD-10-CM

## 2021-04-08 DIAGNOSIS — I10 Essential (primary) hypertension: Secondary | ICD-10-CM | POA: Diagnosis not present

## 2021-04-08 DIAGNOSIS — H4419 Other endophthalmitis: Secondary | ICD-10-CM

## 2021-04-14 ENCOUNTER — Encounter (INDEPENDENT_AMBULATORY_CARE_PROVIDER_SITE_OTHER): Payer: 59 | Admitting: Ophthalmology

## 2021-04-14 DIAGNOSIS — H4419 Other endophthalmitis: Secondary | ICD-10-CM

## 2021-04-24 ENCOUNTER — Emergency Department (HOSPITAL_COMMUNITY)
Admission: EM | Admit: 2021-04-24 | Discharge: 2021-04-24 | Disposition: A | Discharge status: Home / Self Care | Payer: 59 | Attending: Emergency Medicine | Admitting: Emergency Medicine

## 2021-04-24 ENCOUNTER — Encounter (HOSPITAL_COMMUNITY): Payer: Self-pay | Admitting: Emergency Medicine

## 2021-04-24 DIAGNOSIS — R062 Wheezing: Secondary | ICD-10-CM

## 2021-04-24 DIAGNOSIS — J45909 Unspecified asthma, uncomplicated: Secondary | ICD-10-CM | POA: Insufficient documentation

## 2021-04-24 DIAGNOSIS — Z7982 Long term (current) use of aspirin: Secondary | ICD-10-CM | POA: Insufficient documentation

## 2021-04-24 DIAGNOSIS — Z794 Long term (current) use of insulin: Secondary | ICD-10-CM | POA: Insufficient documentation

## 2021-04-24 MED ORDER — ALBUTEROL SULFATE HFA 108 (90 BASE) MCG/ACT IN AERS
INHALATION_SPRAY | RESPIRATORY_TRACT | Status: AC
  Administered 2021-04-24: 2
  Filled 2021-04-24: qty 6.7

## 2021-04-24 MED ORDER — IPRATROPIUM BROMIDE 0.02 % IN SOLN
RESPIRATORY_TRACT | Status: AC
  Administered 2021-04-24: 0.5 mg
  Filled 2021-04-24: qty 2.5

## 2021-04-24 MED ORDER — ALBUTEROL SULFATE (2.5 MG/3ML) 0.083% IN NEBU: 2.5000 mg | INHALATION_SOLUTION | RESPIRATORY_TRACT | 0 refills | Status: DC | PRN

## 2021-04-24 MED ORDER — ALBUTEROL SULFATE (2.5 MG/3ML) 0.083% IN NEBU
INHALATION_SOLUTION | RESPIRATORY_TRACT | Status: AC
  Administered 2021-04-24: 15 mg
  Filled 2021-04-24: qty 18

## 2021-04-24 NOTE — ED Triage Notes (Signed)
Pt c/o shortness of breath r/t his seasonal allergies. Pt states he has a neb machine at home but it has broke and he wasn't able to take his breathing treatments.  ?

## 2021-04-24 NOTE — ED Notes (Signed)
Reviewed mdi inhaler technique ?

## 2021-04-25 NOTE — ED Provider Notes (Signed)
?[facility] ?Provider Note ? ? ?CSN: [id number] ?Arrival date & time: 04/24/21  0241 ? ?  ? ?History ? ?Chief Complaint  ?Patient presents with  ? Asthma  ? ? ?Martin Morales is a 61 y.o. male. ? ?History of asthma but his albuterol nebulizer machine broke and he has not been to take his as needed albuterol for couple days and has been progressively worsening so presents here for the same.  Nothing else is really changed.  No fevers.  No productive cough.  No other associated symptoms.  No chest pain or lower extremity swelling. ? ? ?Asthma ? ? ?  ? ?Home Medications ?Prior to Admission medications   ?Medication Sig Start Date End Date Taking? Authorizing Provider  ?albuterol (PROVENTIL) (2.5 MG/3ML) 0.083% nebulizer solution Take 3 mLs (2.5 mg total) by nebulization every 4 (four) hours as needed for wheezing or shortness of breath. 04/24/21  Yes Areil Ottey, Corene Cornea, MD  ?albuterol (PROVENTIL HFA;VENTOLIN HFA) 108 (90 BASE) MCG/ACT inhaler Inhale 2 puffs into the lungs every 6 (six) hours as needed. Shortness of breath    [provider]  ?amLODipine-atorvastatin (CADUET) 10-40 MG per tablet Take 1 tablet by mouth daily.    [provider]  ?aspirin 325 MG tablet Take 325 mg by mouth daily.    [provider]  ?Insulin Infusion Pump KIT by Does not apply route. novolog pump that gives a minimum dose every 3 minutes and then when patient eats,he figures his carbs and will give a bolus up to 25 units    [provider]  ?levothyroxine (SYNTHROID, LEVOTHROID) 200 MCG tablet Take 200 mcg by mouth daily.     [provider]  ?montelukast (SINGULAIR) 10 MG tablet Take 10 mg by mouth daily.     [provider]  ?naproxen (NAPROSYN) 500 MG tablet Take 1 tablet (500 mg total) by mouth 2 (two) times daily. Take with food 09/13/13   Triplett, Tammy, PA-C  ?ramipril (ALTACE) 10 MG capsule Take 10 mg by mouth daily.    [provider]  ?   ? ?Allergies     ?Patient has no known allergies.   ? ?Review of Systems   ?Review of Systems ? ?Physical Exam ?Updated Vital Signs ?BP (!) 133/48   Pulse (!) 115   Temp 98.1 ?F (36.7 ?C) (Oral)   Resp 16   Ht $R'5\' 11"'RA$  (1.803 m)   Wt 106.6 kg   SpO2 95%   BMI 32.78 kg/m?  ?Physical Exam ?Vitals and nursing note reviewed.  ?Constitutional:   ?   Appearance: He is well-developed.  ?HENT:  ?   Head: Normocephalic and atraumatic.  ?   Mouth/Throat:  ?   Mouth: Mucous membranes are moist.  ?   Pharynx: Oropharynx is clear.  ?Eyes:  ?   Pupils: Pupils are equal, round, and reactive to light.  ?Cardiovascular:  ?   Rate and Rhythm: Normal rate.  ?Pulmonary:  ?   Effort: Pulmonary effort is normal. No respiratory distress.  ?   Breath sounds: Wheezing present.  ?Abdominal:  ?   General: Abdomen is flat. There is no distension.  ?Musculoskeletal:     ?   General: Normal range of motion.  ?   Cervical back: Normal range of motion.  ?Skin: ?   General: Skin is warm and dry.  ?Neurological:  ?   General: No focal deficit present.  ?   Mental Status: He is alert.  ? ? ?  ED Results / Procedures / Treatments   ?Labs ?(all labs ordered are listed, but only abnormal results are displayed) ?Labs Reviewed - No data to display ? ?EKG ?None ? ?Radiology ?No results found. ? ?Procedures ?Procedures  ? ? ?Medications Ordered in ED ?Medications  ?ipratropium (ATROVENT) 0.02 % nebulizer solution (0.5 mg  Given 04/24/21 0338)  ?albuterol (PROVENTIL) (2.5 MG/3ML) 0.083% nebulizer solution (15 mg  Given 04/24/21 0337)  ?albuterol (VENTOLIN HFA) 108 (90 Base) MCG/ACT inhaler (2 puffs  Given 04/24/21 0429)  ? ? ?ED Course/ Medical Decision Making/ A&P ?  ?                        ?Medical Decision Making ?Risk ?Prescription drug management. ? ? ?Symptoms improved here with nebulizer. Requesting d/c. HR likely related albuterol. Equipment provided to help with home nebulizer machine.  ? ?Final Clinical Impression(s) / ED Diagnoses ?Final diagnoses:  ?Wheezing   ? ? ?Rx / DC Orders ?ED Discharge Orders   ? ?      Ordered  ?  albuterol (PROVENTIL) (2.5 MG/3ML) 0.083% nebulizer solution  Every 4 hours PRN       ? 04/24/21 0536  ? ?  ?  ? ?  ? ? ?  ?Merrily Pew, MD ?04/25/21 0407 ? ?

## 2021-04-29 ENCOUNTER — Encounter (INDEPENDENT_AMBULATORY_CARE_PROVIDER_SITE_OTHER): Payer: 59 | Admitting: Ophthalmology

## 2021-04-29 DIAGNOSIS — H35033 Hypertensive retinopathy, bilateral: Secondary | ICD-10-CM | POA: Diagnosis not present

## 2021-04-29 DIAGNOSIS — H43813 Vitreous degeneration, bilateral: Secondary | ICD-10-CM

## 2021-04-29 DIAGNOSIS — H4419 Other endophthalmitis: Secondary | ICD-10-CM | POA: Diagnosis not present

## 2021-04-29 DIAGNOSIS — E103513 Type 1 diabetes mellitus with proliferative diabetic retinopathy with macular edema, bilateral: Secondary | ICD-10-CM | POA: Diagnosis not present

## 2021-04-29 DIAGNOSIS — I10 Essential (primary) hypertension: Secondary | ICD-10-CM

## 2021-05-04 ENCOUNTER — Encounter: Payer: Self-pay | Admitting: Emergency Medicine

## 2021-05-04 ENCOUNTER — Ambulatory Visit
Admission: EM | Admit: 2021-05-04 | Discharge: 2021-05-04 | Disposition: A | Discharge status: Home / Self Care | Payer: 59 | Attending: Family Medicine | Admitting: Family Medicine

## 2021-05-04 DIAGNOSIS — J4541 Moderate persistent asthma with (acute) exacerbation: Secondary | ICD-10-CM

## 2021-05-04 DIAGNOSIS — J3089 Other allergic rhinitis: Secondary | ICD-10-CM | POA: Diagnosis not present

## 2021-05-04 MED ORDER — ALBUTEROL SULFATE (2.5 MG/3ML) 0.083% IN NEBU: 2.5000 mg | INHALATION_SOLUTION | RESPIRATORY_TRACT | 0 refills | Status: AC | PRN

## 2021-05-04 MED ORDER — ALBUTEROL SULFATE (2.5 MG/3ML) 0.083% IN NEBU: 2.5000 mg | INHALATION_SOLUTION | RESPIRATORY_TRACT | 0 refills | Status: DC | PRN

## 2021-05-04 MED ORDER — ALBUTEROL SULFATE HFA 108 (90 BASE) MCG/ACT IN AERS: 2.0000 | INHALATION_SPRAY | Freq: Four times a day (QID) | RESPIRATORY_TRACT | 0 refills | Status: AC | PRN

## 2021-05-04 MED ORDER — PREDNISONE 20 MG PO TABS: 40.0000 mg | ORAL_TABLET | Freq: Every day | ORAL | 0 refills | Status: DC

## 2021-05-04 MED ORDER — DEXAMETHASONE SODIUM PHOSPHATE 10 MG/ML IJ SOLN
10.0000 mg | Freq: Once | INTRAMUSCULAR | Status: AC
  Administered 2021-05-04: 10 mg via INTRAMUSCULAR

## 2021-05-04 NOTE — ED Provider Notes (Signed)
?[facility] ? ? ? ?CSN: [id number] ?Arrival date & time: 05/04/21  7829 ? ? ?  ? ?History   ?Chief Complaint ?No chief complaint on file. ? ? ?HPI ?Martin Morales is a 61 y.o. male.  ? ?Patient presenting today with about a week to a week and a half of dry cough, chest tightness, shortness of breath, wheezing.  Was seen in the emergency department several days ago for the same, given nebulizer treatment and albuterol inhaler since he was out but states that he is having to use his nebulizer frequently throughout the day and still not getting under good control.  Denies fever, chills, chest pain, abdominal pain, nausea vomiting or diarrhea.  States history of significant asthma, takes over-the-counter antihistamine, albuterol with good control unless spring or fall seasons.  States he has tried maintenance inhalers in the past with no benefit. ? ? ?Past Medical History:  ?Diagnosis Date  ? Asthma   ? Diabetes mellitus   ? HTN (hypertension)   ? Hypothyroidism   ? ? ?Patient Active Problem List  ? Diagnosis Date Noted  ? S/P right knee arthroscopy 03/06/2011  ? Medial meniscus, posterior horn derangement 02/21/2011  ? OA (osteoarthritis) of knee 02/21/2011  ? ? ?Past Surgical History:  ?Procedure Laterality Date  ? EYE SURGERY    ? left  ? ? ? ? ? ?Home Medications   ? ?Prior to Admission medications   ?Medication Sig Start Date End Date Taking? Authorizing Provider  ?predniSONE (DELTASONE) 20 MG tablet Take 2 tablets (40 mg total) by mouth daily with breakfast. 05/04/21  Yes Volney American, PA-C  ?albuterol (PROVENTIL) (2.5 MG/3ML) 0.083% nebulizer solution Take 3 mLs (2.5 mg total) by nebulization every 4 (four) hours as needed for wheezing or shortness of breath. 05/04/21   Volney American, PA-C  ?albuterol (VENTOLIN HFA) 108 (90 Base) MCG/ACT inhaler Inhale 2 puffs into the lungs every 6 (six) hours as needed. Shortness of breath 05/04/21   Volney American, PA-C   ?amLODipine-atorvastatin (CADUET) 10-40 MG per tablet Take 1 tablet by mouth daily.    [provider]  ?aspirin 325 MG tablet Take 325 mg by mouth daily.    [provider]  ?Insulin Infusion Pump KIT by Does not apply route. novolog pump that gives a minimum dose every 3 minutes and then when patient eats,he figures his carbs and will give a bolus up to 25 units    [provider]  ?levothyroxine (SYNTHROID, LEVOTHROID) 200 MCG tablet Take 200 mcg by mouth daily.     [provider]  ?montelukast (SINGULAIR) 10 MG tablet Take 10 mg by mouth daily.     [provider]  ?naproxen (NAPROSYN) 500 MG tablet Take 1 tablet (500 mg total) by mouth 2 (two) times daily. Take with food 09/13/13   Triplett, Tammy, PA-C  ?ramipril (ALTACE) 10 MG capsule Take 10 mg by mouth daily.    [provider]  ? ? ?Family History ?Family History  ?Problem Relation Age of Onset  ? Cancer Other   ? Asthma Other   ? Diabetes Other   ? Anesthesia problems Neg Hx   ? Hypotension Neg Hx   ? Malignant hyperthermia Neg Hx   ? Pseudochol deficiency Neg Hx   ? ? ?Social History ?Social History  ? ?Tobacco Use  ? Smoking status: Never  ?Substance Use Topics  ? Alcohol use: No  ? Drug use: No  ? ? ? ?  Allergies   ?Patient has no known allergies. ? ? ?Review of Systems ?Review of Systems ?Per HPI ? ?Physical Exam ?Triage Vital Signs ?ED Triage Vitals  ?Enc Vitals Group  ?   BP 05/04/21 0912 133/67  ?   Pulse Rate 05/04/21 0912 72  ?   Resp 05/04/21 0912 18  ?   Temp 05/04/21 0912 98 ?F (36.7 ?C)  ?   Temp Source 05/04/21 0912 Oral  ?   SpO2 05/04/21 0912 95 %  ?   Weight --   ?   Height --   ?   Head Circumference --   ?   Peak Flow --   ?   Pain Score 05/04/21 0914 0  ?   Pain Loc --   ?   Pain Edu? --   ?   Excl. in [place]? --   ? ?No data found. ? ?Updated Vital Signs ?BP 133/67 (BP Location: Right Arm)   Pulse 72   Temp 98 ?F (36.7 ?C) (Oral)   Resp 18   SpO2 95%  ? ?Visual Acuity ?Right Eye  Distance:   ?Left Eye Distance:   ?Bilateral Distance:   ? ?Right Eye Near:   ?Left Eye Near:    ?Bilateral Near:    ? ?Physical Exam ?Vitals and nursing note reviewed.  ?Constitutional:   ?   Appearance: He is well-developed.  ?HENT:  ?   Head: Atraumatic.  ?   Right Ear: External ear normal.  ?   Left Ear: External ear normal.  ?   Nose: Nose normal.  ?   Mouth/Throat:  ?   Pharynx: Posterior oropharyngeal erythema present. No oropharyngeal exudate.  ?Eyes:  ?   Conjunctiva/sclera: Conjunctivae normal.  ?   Pupils: Pupils are equal, round, and reactive to light.  ?Cardiovascular:  ?   Rate and Rhythm: Normal rate and regular rhythm.  ?Pulmonary:  ?   Effort: Pulmonary effort is normal. No respiratory distress.  ?   Breath sounds: Wheezing present. No rales.  ?Musculoskeletal:     ?   General: Normal range of motion.  ?   Cervical back: Normal range of motion and neck supple.  ?Lymphadenopathy:  ?   Cervical: No cervical adenopathy.  ?Skin: ?   General: Skin is warm and dry.  ?Neurological:  ?   Mental Status: He is alert and oriented to person, place, and time.  ?Psychiatric:     ?   Behavior: Behavior normal.  ? ? ? ?UC Treatments / Results  ?Labs ?(all labs ordered are listed, but only abnormal results are displayed) ?Labs Reviewed - No data to display ? ?EKG ? ? ?Radiology ?No results found. ? ?Procedures ?Procedures (including critical care time) ? ?Medications Ordered in UC ?Medications  ?dexamethasone (DECADRON) injection 10 mg (10 mg Intramuscular Given 05/04/21 0943)  ? ? ?Initial Impression / Assessment and Plan / UC Course  ?I have reviewed the triage vital signs and the nursing notes. ? ?Pertinent labs & imaging results that were available during my care of the patient were reviewed by me and considered in my medical decision making (see chart for details). ? ?  ? ?Vital signs benign and reassuring, suspect asthma exacerbation.  Continue good allergy regimen.  IM Decadron given, prednisone, albuterol  inhaler and nebulizer solution refill sent.  Return for worsening symptoms. ? ?Final Clinical Impressions(s) / UC Diagnoses  ? ?Final diagnoses:  ?Moderate persistent asthma with acute exacerbation  ?Seasonal allergic rhinitis due  to other allergic trigger  ? ?Discharge Instructions   ?None ?  ? ?ED Prescriptions   ? ? Medication Sig Dispense Auth. Provider  ? predniSONE (DELTASONE) 20 MG tablet Take 2 tablets (40 mg total) by mouth daily with breakfast. 10 tablet Volney American, PA-C  ? albuterol (VENTOLIN HFA) 108 (90 Base) MCG/ACT inhaler Inhale 2 puffs into the lungs every 6 (six) hours as needed. Shortness of breath 18 g Volney American, [place]  ? albuterol (PROVENTIL) (2.5 MG/3ML) 0.083% nebulizer solution Take 3 mLs (2.5 mg total) by nebulization every 4 (four) hours as needed for wheezing or shortness of breath. 60 mL Volney American, [place]  ? ?  ? ?PDMP not reviewed this encounter. ?  ?Volney American, PA-C ?05/04/21 0502 ? ?

## 2021-05-04 NOTE — ED Triage Notes (Signed)
Was seen in ED on Friday for cough and wheezing and received a breathing treatment.  States is still wheezing and having to use breathing treatments.   ?

## 2021-06-09 ENCOUNTER — Encounter (INDEPENDENT_AMBULATORY_CARE_PROVIDER_SITE_OTHER): Payer: 59 | Admitting: Ophthalmology

## 2021-06-09 DIAGNOSIS — I10 Essential (primary) hypertension: Secondary | ICD-10-CM

## 2021-06-09 DIAGNOSIS — H43813 Vitreous degeneration, bilateral: Secondary | ICD-10-CM

## 2021-06-09 DIAGNOSIS — H35373 Puckering of macula, bilateral: Secondary | ICD-10-CM | POA: Diagnosis not present

## 2021-06-09 DIAGNOSIS — E113593 Type 2 diabetes mellitus with proliferative diabetic retinopathy without macular edema, bilateral: Secondary | ICD-10-CM

## 2021-06-09 DIAGNOSIS — H35033 Hypertensive retinopathy, bilateral: Secondary | ICD-10-CM | POA: Diagnosis not present

## 2021-09-06 ENCOUNTER — Encounter (INDEPENDENT_AMBULATORY_CARE_PROVIDER_SITE_OTHER): Payer: 59 | Admitting: Ophthalmology

## 2021-09-06 DIAGNOSIS — H35033 Hypertensive retinopathy, bilateral: Secondary | ICD-10-CM | POA: Diagnosis not present

## 2021-09-06 DIAGNOSIS — H35371 Puckering of macula, right eye: Secondary | ICD-10-CM

## 2021-09-06 DIAGNOSIS — E113592 Type 2 diabetes mellitus with proliferative diabetic retinopathy without macular edema, left eye: Secondary | ICD-10-CM

## 2021-09-06 DIAGNOSIS — I10 Essential (primary) hypertension: Secondary | ICD-10-CM | POA: Diagnosis not present

## 2021-09-06 DIAGNOSIS — H43813 Vitreous degeneration, bilateral: Secondary | ICD-10-CM | POA: Diagnosis not present

## 2021-09-06 DIAGNOSIS — E113511 Type 2 diabetes mellitus with proliferative diabetic retinopathy with macular edema, right eye: Secondary | ICD-10-CM | POA: Diagnosis not present

## 2021-09-07 ENCOUNTER — Encounter (INDEPENDENT_AMBULATORY_CARE_PROVIDER_SITE_OTHER): Payer: 59 | Admitting: Ophthalmology

## 2021-12-07 ENCOUNTER — Encounter (INDEPENDENT_AMBULATORY_CARE_PROVIDER_SITE_OTHER): Payer: 59 | Admitting: Ophthalmology

## 2021-12-08 ENCOUNTER — Encounter (INDEPENDENT_AMBULATORY_CARE_PROVIDER_SITE_OTHER): Payer: 59 | Admitting: Ophthalmology

## 2021-12-08 DIAGNOSIS — E113592 Type 2 diabetes mellitus with proliferative diabetic retinopathy without macular edema, left eye: Secondary | ICD-10-CM

## 2021-12-08 DIAGNOSIS — E113511 Type 2 diabetes mellitus with proliferative diabetic retinopathy with macular edema, right eye: Secondary | ICD-10-CM

## 2021-12-08 DIAGNOSIS — H35033 Hypertensive retinopathy, bilateral: Secondary | ICD-10-CM | POA: Diagnosis not present

## 2021-12-08 DIAGNOSIS — H43813 Vitreous degeneration, bilateral: Secondary | ICD-10-CM | POA: Diagnosis not present

## 2021-12-08 DIAGNOSIS — I10 Essential (primary) hypertension: Secondary | ICD-10-CM | POA: Diagnosis not present

## 2022-01-06 ENCOUNTER — Other Ambulatory Visit: Payer: Self-pay | Admitting: Family Medicine

## 2022-01-07 ENCOUNTER — Ambulatory Visit
Admission: EM | Admit: 2022-01-07 | Discharge: 2022-01-07 | Disposition: A | Discharge status: Home / Self Care | Payer: 59 | Attending: Family Medicine | Admitting: Family Medicine

## 2022-01-07 DIAGNOSIS — J069 Acute upper respiratory infection, unspecified: Secondary | ICD-10-CM | POA: Diagnosis present

## 2022-01-07 DIAGNOSIS — Z1152 Encounter for screening for COVID-19: Secondary | ICD-10-CM | POA: Diagnosis not present

## 2022-01-07 DIAGNOSIS — S161XXA Strain of muscle, fascia and tendon at neck level, initial encounter: Secondary | ICD-10-CM | POA: Insufficient documentation

## 2022-01-07 MED ORDER — DEXAMETHASONE SODIUM PHOSPHATE 10 MG/ML IJ SOLN
10.0000 mg | Freq: Once | INTRAMUSCULAR | Status: AC
  Administered 2022-01-07: 10 mg via INTRAMUSCULAR

## 2022-01-07 MED ORDER — TIZANIDINE HCL 4 MG PO CAPS: 4.0000 mg | ORAL_CAPSULE | Freq: Three times a day (TID) | ORAL | 0 refills | Status: DC | PRN

## 2022-01-07 NOTE — ED Provider Notes (Signed)
[facility]    CSN: [id number] Arrival date & time: 01/07/22  1018      History   Chief Complaint Chief Complaint  Patient presents with   Neck Pain    HPI Martin Morales is a 61 y.o. male.   Patient presenting today with 3-day history of neck pain and soreness worse on the right posterior neck but some on the left as well.  States movement makes the pain significantly worse.  About a day or 2 before the symptoms started he had cough, congestion, body aches but states this resolved fairly quickly.  Taking ibuprofen and using heat and neck wraps with minimal relief.  States laying down and taking the pressure off of his neck seems to make the pain a bit better.  Denies fever, vomiting, inability to move the neck, weakness numbness or tingling of extremities, injury to the neck.    Past Medical History:  Diagnosis Date   Asthma    Diabetes mellitus    HTN (hypertension)    Hypothyroidism     Patient Active Problem List   Diagnosis Date Noted   S/P right knee arthroscopy 03/06/2011   Medial meniscus, posterior horn derangement 02/21/2011   OA (osteoarthritis) of knee 02/21/2011    Past Surgical History:  Procedure Laterality Date   EYE SURGERY     left       Home Medications    Prior to Admission medications   Medication Sig Start Date End Date Taking? Authorizing Provider  allopurinol (ZYLOPRIM) 300 MG tablet Take 300 mg by mouth daily. 11/24/21  Yes [provider]  metoprolol succinate (TOPROL-XL) 100 MG 24 hr tablet Take 100 mg by mouth daily. 12/05/21  Yes [provider]  tiZANidine (ZANAFLEX) 4 MG capsule Take 1 capsule (4 mg total) by mouth 3 (three) times daily as needed for muscle spasms. Do not drink alcohol or drive while taking this medication.  May cause drowsiness. 01/07/22  Yes Volney American, PA-C  albuterol (PROVENTIL) (2.5 MG/3ML) 0.083% nebulizer solution Take 3 mLs (2.5 mg total) by nebulization every 4  (four) hours as needed for wheezing or shortness of breath. 05/04/21   Volney American, PA-C  albuterol (VENTOLIN HFA) 108 (90 Base) MCG/ACT inhaler Inhale 2 puffs into the lungs every 6 (six) hours as needed. Shortness of breath 05/04/21   Volney American, PA-C  amLODipine-atorvastatin (CADUET) 10-40 MG per tablet Take 1 tablet by mouth daily.    [provider]  aspirin 325 MG tablet Take 325 mg by mouth daily.    [provider]  Insulin Infusion Pump KIT by Does not apply route. novolog pump that gives a minimum dose every 3 minutes and then when patient eats,he figures his carbs and will give a bolus up to 25 units    [provider]  levothyroxine (SYNTHROID, LEVOTHROID) 200 MCG tablet Take 200 mcg by mouth daily.     [provider]  montelukast (SINGULAIR) 10 MG tablet Take 10 mg by mouth daily.     [provider]  naproxen (NAPROSYN) 500 MG tablet Take 1 tablet (500 mg total) by mouth 2 (two) times daily. Take with food 09/13/13   Triplett, Tammy, PA-C  predniSONE (DELTASONE) 20 MG tablet Take 2 tablets (40 mg total) by mouth daily with breakfast. 05/04/21   Volney American, PA-C  ramipril (ALTACE) 10 MG capsule Take 10 mg by mouth daily.    [provider]  Family History Family History  Problem Relation Age of Onset   Cancer Other    Asthma Other    Diabetes Other    Anesthesia problems Neg Hx    Hypotension Neg Hx    Malignant hyperthermia Neg Hx    Pseudochol deficiency Neg Hx     Social History Social History   Tobacco Use   Smoking status: Never  Substance Use Topics   Alcohol use: No   Drug use: No     Allergies   Patient has no known allergies.   Review of Systems Review of Systems Per HPI  Physical Exam Triage Vital Signs ED Triage Vitals  Enc Vitals Group     BP 01/07/22 1254 (!) 152/79     Pulse Rate 01/07/22 1254 95     Resp 01/07/22 1254 20     Temp 01/07/22 1254 98.7 F  (37.1 C)     Temp Source 01/07/22 1254 Oral     SpO2 01/07/22 1254 96 %     Weight --      Height --      Head Circumference --      Peak Flow --      Pain Score 01/07/22 1256 8     Pain Loc --      Pain Edu? --      Excl. in [place]? --    No data found.  Updated Vital Signs BP (!) 152/79 (BP Location: Right Arm)   Pulse 95   Temp 98.7 F (37.1 C) (Oral)   Resp 20   SpO2 96%   Visual Acuity Right Eye Distance:   Left Eye Distance:   Bilateral Distance:    Right Eye Near:   Left Eye Near:    Bilateral Near:     Physical Exam Vitals and nursing note reviewed.  Constitutional:      Appearance: Normal appearance.  HENT:     Head: Atraumatic.  Eyes:     Extraocular Movements: Extraocular movements intact.     Conjunctiva/sclera: Conjunctivae normal.  Cardiovascular:     Rate and Rhythm: Normal rate and regular rhythm.  Pulmonary:     Effort: Pulmonary effort is normal.     Breath sounds: Normal breath sounds.  Musculoskeletal:        General: Tenderness present. No swelling or deformity. Normal range of motion.     Cervical back: Normal range of motion and neck supple.     Comments: Bilateral cervical paraspinal muscles tender to palpation.  Range of motion of the cervical spine intact but painful  Skin:    General: Skin is warm and dry.  Neurological:     General: No focal deficit present.     Mental Status: He is oriented to person, place, and time.     Comments: Negative for meningeal signs, all 4 extremities neurovascularly intact  Psychiatric:        Mood and Affect: Mood normal.        Thought Content: Thought content normal.        Judgment: Judgment normal.      UC Treatments / Results  Labs (all labs ordered are listed, but only abnormal results are displayed) Labs Reviewed  SARS CORONAVIRUS 2 (TAT 6-24 HRS)    EKG   Radiology No results found.  Procedures Procedures (including critical care time)  Medications Ordered in UC Medications   dexamethasone (DECADRON) injection 10 mg (10 mg Intramuscular Given 01/07/22 1328)    Initial Impression /  Assessment and Plan / UC Course  I have reviewed the triage vital signs and the nursing notes.  Pertinent labs & imaging results that were available during my care of the patient were reviewed by me and considered in my medical decision making (see chart for details).     Requesting viral testing today due to his symptoms several days ago, COVID testing pending.  Over-the-counter cold and congestion medications as needed for this.  Regarding his neck pain, suspect muscular nature.  No red flag findings today on exam or history.  Discussed IM Decadron, heat, Zanaflex, stretches, massage, rest.  Return precautions reviewed.  Final Clinical Impressions(s) / UC Diagnoses   Final diagnoses:  Viral URI with cough  Strain of neck muscle, initial encounter     Discharge Instructions      Apply heat and neck wraps, massage, gentle stretches and take the muscle relaxer as needed.  We have given you a steroid shot today to hopefully help with the inflammation.  We have also tested you for COVID given your viral symptoms several days ago.  Follow-up for worsening symptoms.    ED Prescriptions     Medication Sig Dispense Auth. Provider   tiZANidine (ZANAFLEX) 4 MG capsule Take 1 capsule (4 mg total) by mouth 3 (three) times daily as needed for muscle spasms. Do not drink alcohol or drive while taking this medication.  May cause drowsiness. 15 capsule Volney American, [place]      PDMP not reviewed this encounter.   Volney American, [place] 01/07/22 1329

## 2022-01-07 NOTE — ED Triage Notes (Signed)
Pt reports neck pain x 3 days. Shoulders, ears, and neck started hurting worse since yesterday. Took ibuprofen but no relief. Stabbing pains in neck. Pain increases with movement

## 2022-01-07 NOTE — Discharge Instructions (Signed)
Apply heat and neck wraps, massage, gentle stretches and take the muscle relaxer as needed.  We have given you a steroid shot today to hopefully help with the inflammation.  We have also tested you for COVID given your viral symptoms several days ago.  Follow-up for worsening symptoms.

## 2022-01-07 NOTE — Telephone Encounter (Signed)
No longer under prescriber care, will refuse.   Requested Prescriptions  Pending Prescriptions Disp Refills   albuterol (PROVENTIL) (2.5 MG/3ML) 0.083% nebulizer solution [Pharmacy Med Name: ALBUTEROL SULFATE (2.5 MG/3ML) 0.08] 180 mL     Sig: USE ONE VIAL IN NEBULIZER EVERY FOUR HOURS AS NEEDED FOR WHEEZING OR SHORTNESS OF BREATH     There is no refill protocol information for this order

## 2022-01-08 LAB — SARS CORONAVIRUS 2 (TAT 6-24 HRS): SARS Coronavirus 2: NEGATIVE

## 2022-03-08 ENCOUNTER — Encounter (INDEPENDENT_AMBULATORY_CARE_PROVIDER_SITE_OTHER): Payer: 59 | Admitting: Ophthalmology

## 2022-03-08 DIAGNOSIS — H35033 Hypertensive retinopathy, bilateral: Secondary | ICD-10-CM

## 2022-03-08 DIAGNOSIS — H35372 Puckering of macula, left eye: Secondary | ICD-10-CM

## 2022-03-08 DIAGNOSIS — E103511 Type 1 diabetes mellitus with proliferative diabetic retinopathy with macular edema, right eye: Secondary | ICD-10-CM | POA: Diagnosis not present

## 2022-03-08 DIAGNOSIS — E103592 Type 1 diabetes mellitus with proliferative diabetic retinopathy without macular edema, left eye: Secondary | ICD-10-CM | POA: Diagnosis not present

## 2022-03-08 DIAGNOSIS — H43813 Vitreous degeneration, bilateral: Secondary | ICD-10-CM

## 2022-03-08 DIAGNOSIS — I10 Essential (primary) hypertension: Secondary | ICD-10-CM | POA: Diagnosis not present

## 2022-06-08 ENCOUNTER — Encounter (INDEPENDENT_AMBULATORY_CARE_PROVIDER_SITE_OTHER): Payer: 59 | Admitting: Ophthalmology

## 2022-06-08 DIAGNOSIS — E103592 Type 1 diabetes mellitus with proliferative diabetic retinopathy without macular edema, left eye: Secondary | ICD-10-CM

## 2022-06-08 DIAGNOSIS — H35033 Hypertensive retinopathy, bilateral: Secondary | ICD-10-CM | POA: Diagnosis not present

## 2022-06-08 DIAGNOSIS — I10 Essential (primary) hypertension: Secondary | ICD-10-CM | POA: Diagnosis not present

## 2022-06-08 DIAGNOSIS — E103511 Type 1 diabetes mellitus with proliferative diabetic retinopathy with macular edema, right eye: Secondary | ICD-10-CM

## 2022-06-08 DIAGNOSIS — Z7984 Long term (current) use of oral hypoglycemic drugs: Secondary | ICD-10-CM

## 2022-06-08 DIAGNOSIS — H43813 Vitreous degeneration, bilateral: Secondary | ICD-10-CM

## 2022-07-26 ENCOUNTER — Other Ambulatory Visit (HOSPITAL_COMMUNITY): Payer: Self-pay

## 2022-07-26 DIAGNOSIS — E785 Hyperlipidemia, unspecified: Secondary | ICD-10-CM

## 2022-08-08 ENCOUNTER — Encounter (HOSPITAL_COMMUNITY): Payer: Self-pay

## 2022-08-08 ENCOUNTER — Ambulatory Visit (HOSPITAL_COMMUNITY)
Admission: RE | Admit: 2022-08-08 | Discharge: 2022-08-08 | Disposition: A | Discharge status: Home / Self Care | Payer: 59 | Source: Ambulatory Visit

## 2022-08-08 DIAGNOSIS — E785 Hyperlipidemia, unspecified: Secondary | ICD-10-CM | POA: Insufficient documentation

## 2022-09-14 ENCOUNTER — Encounter (INDEPENDENT_AMBULATORY_CARE_PROVIDER_SITE_OTHER): Payer: 59 | Admitting: Ophthalmology

## 2022-09-14 DIAGNOSIS — I10 Essential (primary) hypertension: Secondary | ICD-10-CM | POA: Diagnosis not present

## 2022-09-14 DIAGNOSIS — H43813 Vitreous degeneration, bilateral: Secondary | ICD-10-CM

## 2022-09-14 DIAGNOSIS — Z7984 Long term (current) use of oral hypoglycemic drugs: Secondary | ICD-10-CM | POA: Diagnosis not present

## 2022-09-14 DIAGNOSIS — E113592 Type 2 diabetes mellitus with proliferative diabetic retinopathy without macular edema, left eye: Secondary | ICD-10-CM

## 2022-09-14 DIAGNOSIS — H35033 Hypertensive retinopathy, bilateral: Secondary | ICD-10-CM

## 2022-09-14 DIAGNOSIS — E113511 Type 2 diabetes mellitus with proliferative diabetic retinopathy with macular edema, right eye: Secondary | ICD-10-CM | POA: Diagnosis not present

## 2022-12-22 ENCOUNTER — Other Ambulatory Visit (HOSPITAL_COMMUNITY): Payer: Self-pay | Admitting: Nephrology

## 2022-12-22 DIAGNOSIS — N1832 Chronic kidney disease, stage 3b: Secondary | ICD-10-CM

## 2022-12-27 ENCOUNTER — Ambulatory Visit (HOSPITAL_COMMUNITY)
Admission: RE | Admit: 2022-12-27 | Discharge: 2022-12-27 | Disposition: A | Discharge status: Home / Self Care | Payer: 59 | Source: Ambulatory Visit | Attending: Nephrology | Admitting: Nephrology

## 2022-12-27 ENCOUNTER — Encounter (INDEPENDENT_AMBULATORY_CARE_PROVIDER_SITE_OTHER): Payer: 59 | Admitting: Ophthalmology

## 2022-12-27 DIAGNOSIS — I10 Essential (primary) hypertension: Secondary | ICD-10-CM

## 2022-12-27 DIAGNOSIS — E103592 Type 1 diabetes mellitus with proliferative diabetic retinopathy without macular edema, left eye: Secondary | ICD-10-CM

## 2022-12-27 DIAGNOSIS — Z7984 Long term (current) use of oral hypoglycemic drugs: Secondary | ICD-10-CM | POA: Diagnosis not present

## 2022-12-27 DIAGNOSIS — H43811 Vitreous degeneration, right eye: Secondary | ICD-10-CM

## 2022-12-27 DIAGNOSIS — E103511 Type 1 diabetes mellitus with proliferative diabetic retinopathy with macular edema, right eye: Secondary | ICD-10-CM | POA: Diagnosis not present

## 2022-12-27 DIAGNOSIS — H35033 Hypertensive retinopathy, bilateral: Secondary | ICD-10-CM

## 2022-12-27 DIAGNOSIS — N1832 Chronic kidney disease, stage 3b: Secondary | ICD-10-CM | POA: Insufficient documentation

## 2023-04-12 ENCOUNTER — Encounter (INDEPENDENT_AMBULATORY_CARE_PROVIDER_SITE_OTHER): Payer: 59 | Admitting: Ophthalmology

## 2023-04-12 DIAGNOSIS — H43813 Vitreous degeneration, bilateral: Secondary | ICD-10-CM

## 2023-04-12 DIAGNOSIS — E103592 Type 1 diabetes mellitus with proliferative diabetic retinopathy without macular edema, left eye: Secondary | ICD-10-CM | POA: Diagnosis not present

## 2023-04-12 DIAGNOSIS — I10 Essential (primary) hypertension: Secondary | ICD-10-CM | POA: Diagnosis not present

## 2023-04-12 DIAGNOSIS — Z7984 Long term (current) use of oral hypoglycemic drugs: Secondary | ICD-10-CM | POA: Diagnosis not present

## 2023-04-12 DIAGNOSIS — H35033 Hypertensive retinopathy, bilateral: Secondary | ICD-10-CM

## 2023-04-12 DIAGNOSIS — E103511 Type 1 diabetes mellitus with proliferative diabetic retinopathy with macular edema, right eye: Secondary | ICD-10-CM

## 2023-04-12 DIAGNOSIS — H35373 Puckering of macula, bilateral: Secondary | ICD-10-CM

## 2023-04-28 ENCOUNTER — Emergency Department (HOSPITAL_COMMUNITY)

## 2023-04-28 ENCOUNTER — Inpatient Hospital Stay (HOSPITAL_COMMUNITY)
Admission: EM | Admit: 2023-04-28 | Discharge: 2023-05-01 | DRG: 871 | Disposition: A | Discharge status: Home / Self Care | Attending: Internal Medicine | Admitting: Internal Medicine

## 2023-04-28 ENCOUNTER — Encounter (HOSPITAL_COMMUNITY): Payer: Self-pay | Admitting: Emergency Medicine

## 2023-04-28 ENCOUNTER — Other Ambulatory Visit: Payer: Self-pay

## 2023-04-28 DIAGNOSIS — R55 Syncope and collapse: Secondary | ICD-10-CM

## 2023-04-28 DIAGNOSIS — Z79890 Hormone replacement therapy (postmenopausal): Secondary | ICD-10-CM

## 2023-04-28 DIAGNOSIS — E875 Hyperkalemia: Secondary | ICD-10-CM | POA: Diagnosis present

## 2023-04-28 DIAGNOSIS — N179 Acute kidney failure, unspecified: Secondary | ICD-10-CM | POA: Diagnosis present

## 2023-04-28 DIAGNOSIS — Z7951 Long term (current) use of inhaled steroids: Secondary | ICD-10-CM

## 2023-04-28 DIAGNOSIS — Z7982 Long term (current) use of aspirin: Secondary | ICD-10-CM

## 2023-04-28 DIAGNOSIS — E10649 Type 1 diabetes mellitus with hypoglycemia without coma: Secondary | ICD-10-CM | POA: Diagnosis not present

## 2023-04-28 DIAGNOSIS — E1022 Type 1 diabetes mellitus with diabetic chronic kidney disease: Secondary | ICD-10-CM | POA: Diagnosis present

## 2023-04-28 DIAGNOSIS — J189 Pneumonia, unspecified organism: Secondary | ICD-10-CM | POA: Diagnosis present

## 2023-04-28 DIAGNOSIS — N1832 Chronic kidney disease, stage 3b: Secondary | ICD-10-CM | POA: Diagnosis present

## 2023-04-28 DIAGNOSIS — Z79899 Other long term (current) drug therapy: Secondary | ICD-10-CM

## 2023-04-28 DIAGNOSIS — J690 Pneumonitis due to inhalation of food and vomit: Principal | ICD-10-CM | POA: Diagnosis present

## 2023-04-28 DIAGNOSIS — R404 Transient alteration of awareness: Secondary | ICD-10-CM | POA: Diagnosis not present

## 2023-04-28 DIAGNOSIS — A419 Sepsis, unspecified organism: Secondary | ICD-10-CM | POA: Diagnosis not present

## 2023-04-28 DIAGNOSIS — I129 Hypertensive chronic kidney disease with stage 1 through stage 4 chronic kidney disease, or unspecified chronic kidney disease: Secondary | ICD-10-CM | POA: Diagnosis present

## 2023-04-28 DIAGNOSIS — T68XXXA Hypothermia, initial encounter: Principal | ICD-10-CM | POA: Diagnosis present

## 2023-04-28 DIAGNOSIS — Z794 Long term (current) use of insulin: Secondary | ICD-10-CM

## 2023-04-28 DIAGNOSIS — Z825 Family history of asthma and other chronic lower respiratory diseases: Secondary | ICD-10-CM

## 2023-04-28 DIAGNOSIS — Z9641 Presence of insulin pump (external) (internal): Secondary | ICD-10-CM | POA: Diagnosis present

## 2023-04-28 DIAGNOSIS — E162 Hypoglycemia, unspecified: Secondary | ICD-10-CM

## 2023-04-28 DIAGNOSIS — E119 Type 2 diabetes mellitus without complications: Secondary | ICD-10-CM

## 2023-04-28 DIAGNOSIS — E872 Acidosis, unspecified: Secondary | ICD-10-CM | POA: Diagnosis present

## 2023-04-28 DIAGNOSIS — W010XXA Fall on same level from slipping, tripping and stumbling without subsequent striking against object, initial encounter: Secondary | ICD-10-CM | POA: Diagnosis present

## 2023-04-28 DIAGNOSIS — E039 Hypothyroidism, unspecified: Secondary | ICD-10-CM | POA: Diagnosis present

## 2023-04-28 DIAGNOSIS — R680 Hypothermia, not associated with low environmental temperature: Secondary | ICD-10-CM | POA: Diagnosis present

## 2023-04-28 DIAGNOSIS — Z833 Family history of diabetes mellitus: Secondary | ICD-10-CM

## 2023-04-28 DIAGNOSIS — G9341 Metabolic encephalopathy: Secondary | ICD-10-CM | POA: Diagnosis present

## 2023-04-28 DIAGNOSIS — J45909 Unspecified asthma, uncomplicated: Secondary | ICD-10-CM | POA: Diagnosis present

## 2023-04-28 DIAGNOSIS — K219 Gastro-esophageal reflux disease without esophagitis: Secondary | ICD-10-CM | POA: Diagnosis present

## 2023-04-28 DIAGNOSIS — R0682 Tachypnea, not elsewhere classified: Secondary | ICD-10-CM | POA: Diagnosis present

## 2023-04-28 LAB — URINALYSIS, ROUTINE W REFLEX MICROSCOPIC
Bacteria, UA: NONE SEEN
Bilirubin Urine: NEGATIVE
Glucose, UA: 50 mg/dL — AB
Ketones, ur: NEGATIVE mg/dL
Leukocytes,Ua: NEGATIVE
Nitrite: NEGATIVE
Protein, ur: 100 mg/dL — AB
Specific Gravity, Urine: 1.013 (ref 1.005–1.030)
pH: 5 (ref 5.0–8.0)

## 2023-04-28 LAB — I-STAT CHEM 8, ED
BUN: 48 mg/dL — ABNORMAL HIGH (ref 8–23)
Calcium, Ion: 1.22 mmol/L (ref 1.15–1.40)
Chloride: 109 mmol/L (ref 98–111)
Creatinine, Ser: 2.2 mg/dL — ABNORMAL HIGH (ref 0.61–1.24)
Glucose, Bld: 161 mg/dL — ABNORMAL HIGH (ref 70–99)
HCT: 38 % — ABNORMAL LOW (ref 39.0–52.0)
Hemoglobin: 12.9 g/dL — ABNORMAL LOW (ref 13.0–17.0)
Potassium: 4.5 mmol/L (ref 3.5–5.1)
Sodium: 141 mmol/L (ref 135–145)
TCO2: 24 mmol/L (ref 22–32)

## 2023-04-28 LAB — CBG MONITORING, ED: Glucose-Capillary: 144 mg/dL — ABNORMAL HIGH (ref 70–99)

## 2023-04-28 LAB — I-STAT CG4 LACTIC ACID, ED: Lactic Acid, Venous: 1.2 mmol/L (ref 0.5–1.9)

## 2023-04-28 MED ORDER — SODIUM CHLORIDE 0.9 % IV BOLUS
1000.0000 mL | Freq: Once | INTRAVENOUS | Status: AC
  Administered 2023-04-28: 1000 mL via INTRAVENOUS

## 2023-04-28 MED ORDER — IOHEXOL 350 MG/ML SOLN
60.0000 mL | Freq: Once | INTRAVENOUS | Status: AC | PRN
  Administered 2023-04-28: 60 mL via INTRAVENOUS
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
## 2023-04-28 NOTE — ED Notes (Signed)
 Bear hugger applied
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
## 2023-04-29 ENCOUNTER — Encounter (HOSPITAL_COMMUNITY): Payer: Self-pay | Admitting: Family Medicine

## 2023-04-29 DIAGNOSIS — E119 Type 2 diabetes mellitus without complications: Secondary | ICD-10-CM | POA: Diagnosis not present

## 2023-04-29 DIAGNOSIS — J690 Pneumonitis due to inhalation of food and vomit: Secondary | ICD-10-CM | POA: Diagnosis present

## 2023-04-29 DIAGNOSIS — N179 Acute kidney failure, unspecified: Secondary | ICD-10-CM | POA: Diagnosis not present

## 2023-04-29 DIAGNOSIS — E039 Hypothyroidism, unspecified: Secondary | ICD-10-CM | POA: Diagnosis not present

## 2023-04-29 DIAGNOSIS — J45909 Unspecified asthma, uncomplicated: Secondary | ICD-10-CM | POA: Diagnosis present

## 2023-04-29 DIAGNOSIS — Z794 Long term (current) use of insulin: Secondary | ICD-10-CM

## 2023-04-29 DIAGNOSIS — E162 Hypoglycemia, unspecified: Secondary | ICD-10-CM

## 2023-04-29 DIAGNOSIS — T68XXXA Hypothermia, initial encounter: Secondary | ICD-10-CM | POA: Diagnosis present

## 2023-04-29 LAB — CBC
HCT: 35 % — ABNORMAL LOW (ref 39.0–52.0)
HCT: 34 % — ABNORMAL LOW (ref 39.0–52.0)
Hemoglobin: 11.3 g/dL — ABNORMAL LOW (ref 13.0–17.0)
Hemoglobin: 11.2 g/dL — ABNORMAL LOW (ref 13.0–17.0)
MCH: 30.2 pg (ref 26.0–34.0)
MCH: 30.4 pg (ref 26.0–34.0)
MCHC: 32.3 g/dL (ref 30.0–36.0)
MCHC: 32.9 g/dL (ref 30.0–36.0)
MCV: 93.6 fL (ref 80.0–100.0)
MCV: 92.1 fL (ref 80.0–100.0)
Platelets: 249 10*3/uL (ref 150–400)
Platelets: 262 10*3/uL (ref 150–400)
RBC: 3.74 MIL/uL — ABNORMAL LOW (ref 4.22–5.81)
RBC: 3.69 MIL/uL — ABNORMAL LOW (ref 4.22–5.81)
RDW: 14.4 % (ref 11.5–15.5)
RDW: 14.3 % (ref 11.5–15.5)
WBC: 11.4 10*3/uL — ABNORMAL HIGH (ref 4.0–10.5)
WBC: 16.9 10*3/uL — ABNORMAL HIGH (ref 4.0–10.5)
nRBC: 0 % (ref 0.0–0.2)
nRBC: 0 % (ref 0.0–0.2)

## 2023-04-29 LAB — BASIC METABOLIC PANEL WITH GFR
Anion gap: 13 (ref 5–15)
Anion gap: 11 (ref 5–15)
Anion gap: 12 (ref 5–15)
Anion gap: 12 (ref 5–15)
BUN: 45 mg/dL — ABNORMAL HIGH (ref 8–23)
BUN: 56 mg/dL — ABNORMAL HIGH (ref 8–23)
BUN: 59 mg/dL — ABNORMAL HIGH (ref 8–23)
BUN: 57 mg/dL — ABNORMAL HIGH (ref 8–23)
CO2: 16 mmol/L — ABNORMAL LOW (ref 22–32)
CO2: 16 mmol/L — ABNORMAL LOW (ref 22–32)
CO2: 17 mmol/L — ABNORMAL LOW (ref 22–32)
CO2: 18 mmol/L — ABNORMAL LOW (ref 22–32)
Calcium: 8.6 mg/dL — ABNORMAL LOW (ref 8.9–10.3)
Calcium: 8.4 mg/dL — ABNORMAL LOW (ref 8.9–10.3)
Calcium: 8.4 mg/dL — ABNORMAL LOW (ref 8.9–10.3)
Calcium: 8.7 mg/dL — ABNORMAL LOW (ref 8.9–10.3)
Chloride: 107 mmol/L (ref 98–111)
Chloride: 104 mmol/L (ref 98–111)
Chloride: 103 mmol/L (ref 98–111)
Chloride: 103 mmol/L (ref 98–111)
Creatinine, Ser: 2.01 mg/dL — ABNORMAL HIGH (ref 0.61–1.24)
Creatinine, Ser: 2.58 mg/dL — ABNORMAL HIGH (ref 0.61–1.24)
Creatinine, Ser: 2.65 mg/dL — ABNORMAL HIGH (ref 0.61–1.24)
Creatinine, Ser: 2.71 mg/dL — ABNORMAL HIGH (ref 0.61–1.24)
GFR, Estimated: 37 mL/min — ABNORMAL LOW (ref >60)
GFR, Estimated: 27 mL/min — ABNORMAL LOW (ref >60)
GFR, Estimated: 26 mL/min — ABNORMAL LOW (ref >60)
GFR, Estimated: 26 mL/min — ABNORMAL LOW (ref >60)
Glucose, Bld: 204 mg/dL — ABNORMAL HIGH (ref 70–99)
Glucose, Bld: 457 mg/dL — ABNORMAL HIGH (ref 70–99)
Glucose, Bld: 478 mg/dL — ABNORMAL HIGH (ref 70–99)
Glucose, Bld: 385 mg/dL — ABNORMAL HIGH (ref 70–99)
Potassium: 5.4 mmol/L — ABNORMAL HIGH (ref 3.5–5.1)
Potassium: 6 mmol/L — ABNORMAL HIGH (ref 3.5–5.1)
Potassium: 5.6 mmol/L — ABNORMAL HIGH (ref 3.5–5.1)
Potassium: 5 mmol/L (ref 3.5–5.1)
Sodium: 136 mmol/L (ref 135–145)
Sodium: 131 mmol/L — ABNORMAL LOW (ref 135–145)
Sodium: 132 mmol/L — ABNORMAL LOW (ref 135–145)
Sodium: 133 mmol/L — ABNORMAL LOW (ref 135–145)

## 2023-04-29 LAB — COMPREHENSIVE METABOLIC PANEL WITH GFR
ALT: 29 U/L (ref 0–44)
AST: 36 U/L (ref 15–41)
Albumin: 3.2 g/dL — ABNORMAL LOW (ref 3.5–5.0)
Alkaline Phosphatase: 93 U/L (ref 38–126)
Anion gap: 10 (ref 5–15)
BUN: 43 mg/dL — ABNORMAL HIGH (ref 8–23)
CO2: 17 mmol/L — ABNORMAL LOW (ref 22–32)
Calcium: 8.4 mg/dL — ABNORMAL LOW (ref 8.9–10.3)
Chloride: 112 mmol/L — ABNORMAL HIGH (ref 98–111)
Creatinine, Ser: 1.74 mg/dL — ABNORMAL HIGH (ref 0.61–1.24)
GFR, Estimated: 44 mL/min — ABNORMAL LOW (ref >60)
Glucose, Bld: 100 mg/dL — ABNORMAL HIGH (ref 70–99)
Potassium: 4.7 mmol/L (ref 3.5–5.1)
Sodium: 139 mmol/L (ref 135–145)
Total Bilirubin: 0.5 mg/dL (ref 0.0–1.2)
Total Protein: 6 g/dL — ABNORMAL LOW (ref 6.5–8.1)

## 2023-04-29 LAB — SAMPLE TO BLOOD BANK

## 2023-04-29 LAB — ETHANOL: Alcohol, Ethyl (B): <10 mg/dL (ref <10)

## 2023-04-29 LAB — HEMOGLOBIN A1C
Hgb A1c MFr Bld: 7.5 % — ABNORMAL HIGH (ref 4.8–5.6)
Mean Plasma Glucose: 168.55 mg/dL

## 2023-04-29 LAB — CBG MONITORING, ED
Glucose-Capillary: 101 mg/dL — ABNORMAL HIGH (ref 70–99)
Glucose-Capillary: 102 mg/dL — ABNORMAL HIGH (ref 70–99)
Glucose-Capillary: 128 mg/dL — ABNORMAL HIGH (ref 70–99)
Glucose-Capillary: 294 mg/dL — ABNORMAL HIGH (ref 70–99)
Glucose-Capillary: 346 mg/dL — ABNORMAL HIGH (ref 70–99)
Glucose-Capillary: 401 mg/dL — ABNORMAL HIGH (ref 70–99)
Glucose-Capillary: 429 mg/dL — ABNORMAL HIGH (ref 70–99)

## 2023-04-29 LAB — GLUCOSE, CAPILLARY: Glucose-Capillary: 203 mg/dL — ABNORMAL HIGH (ref 70–99)

## 2023-04-29 LAB — TROPONIN I (HIGH SENSITIVITY)
Troponin I (High Sensitivity): 9 ng/L (ref <18)
Troponin I (High Sensitivity): 14 ng/L (ref <18)

## 2023-04-29 LAB — PROTIME-INR
INR: 1 (ref 0.8–1.2)
Prothrombin Time: 13.2 s (ref 11.4–15.2)

## 2023-04-29 LAB — LACTIC ACID, PLASMA
Lactic Acid, Venous: 1.6 mmol/L (ref 0.5–1.9)
Lactic Acid, Venous: 1 mmol/L (ref 0.5–1.9)

## 2023-04-29 LAB — HIV ANTIBODY (ROUTINE TESTING W REFLEX): HIV Screen 4th Generation wRfx: NONREACTIVE

## 2023-04-29 MED ORDER — INSULIN ASPART 100 UNIT/ML IJ SOLN: 0.0000 [IU] | Freq: Every day | INTRAMUSCULAR | Status: DC

## 2023-04-29 MED ORDER — ATORVASTATIN CALCIUM 40 MG PO TABS
40.0000 mg | ORAL_TABLET | Freq: Every day | ORAL | Status: DC
  Administered 2023-04-29 – 2023-04-30 (×2): 40 mg via ORAL
  Filled 2023-04-29 (×2): qty 1

## 2023-04-29 MED ORDER — INSULIN GLARGINE-YFGN 100 UNIT/ML ~~LOC~~ SOLN
10.0000 [IU] | Freq: Once | SUBCUTANEOUS | Status: AC
  Administered 2023-04-29: 10 [IU] via SUBCUTANEOUS
  Filled 2023-04-29: qty 0.1

## 2023-04-29 MED ORDER — PANTOPRAZOLE SODIUM 40 MG IV SOLR
40.0000 mg | Freq: Once | INTRAVENOUS | Status: AC
  Administered 2023-04-29: 40 mg via INTRAVENOUS
  Filled 2023-04-29: qty 10

## 2023-04-29 MED ORDER — INSULIN ASPART 100 UNIT/ML IJ SOLN
0.0000 [IU] | Freq: Three times a day (TID) | INTRAMUSCULAR | Status: DC
  Administered 2023-04-29: 20 [IU] via SUBCUTANEOUS
  Administered 2023-04-29: 15 [IU] via SUBCUTANEOUS

## 2023-04-29 MED ORDER — INSULIN ASPART 100 UNIT/ML IJ SOLN: 0.0000 [IU] | Freq: Three times a day (TID) | INTRAMUSCULAR | Status: DC

## 2023-04-29 MED ORDER — LEVOTHYROXINE SODIUM 50 MCG PO TABS
250.0000 ug | ORAL_TABLET | Freq: Every day | ORAL | Status: DC
  Administered 2023-04-29 – 2023-05-01 (×3): 250 ug via ORAL
  Filled 2023-04-29: qty 1
  Filled 2023-04-29: qty 2
  Filled 2023-04-29: qty 1

## 2023-04-29 MED ORDER — ONDANSETRON HCL 4 MG/2ML IJ SOLN: 4.0000 mg | Freq: Four times a day (QID) | INTRAMUSCULAR | Status: DC | PRN

## 2023-04-29 MED ORDER — PIPERACILLIN-TAZOBACTAM 3.375 G IVPB
3.3750 g | Freq: Three times a day (TID) | INTRAVENOUS | Status: DC
  Administered 2023-04-29: 3.375 g via INTRAVENOUS
  Filled 2023-04-29: qty 50

## 2023-04-29 MED ORDER — METOPROLOL SUCCINATE ER 50 MG PO TB24
50.0000 mg | ORAL_TABLET | Freq: Every day | ORAL | Status: DC
  Administered 2023-04-29 – 2023-04-30 (×2): 50 mg via ORAL
  Filled 2023-04-29: qty 2
  Filled 2023-04-29: qty 1

## 2023-04-29 MED ORDER — STERILE WATER FOR INJECTION IV SOLN
INTRAVENOUS | Status: DC
  Filled 2023-04-29 (×2): qty 1000
  Filled 2023-04-29: qty 150

## 2023-04-29 MED ORDER — SODIUM CHLORIDE 0.9 % IV SOLN
3.0000 g | Freq: Two times a day (BID) | INTRAVENOUS | Status: DC
  Administered 2023-04-29: 3 g via INTRAVENOUS
  Filled 2023-04-29: qty 8

## 2023-04-29 MED ORDER — AMLODIPINE BESYLATE 5 MG PO TABS: 10.0000 mg | ORAL_TABLET | Freq: Every day | ORAL | Status: DC

## 2023-04-29 MED ORDER — SENNOSIDES-DOCUSATE SODIUM 8.6-50 MG PO TABS: 1.0000 | ORAL_TABLET | Freq: Every evening | ORAL | Status: DC | PRN

## 2023-04-29 MED ORDER — FLUTICASONE FUROATE-VILANTEROL 100-25 MCG/ACT IN AEPB
1.0000 | INHALATION_SPRAY | Freq: Every day | RESPIRATORY_TRACT | Status: DC
  Administered 2023-04-30 – 2023-05-01 (×2): 1 via RESPIRATORY_TRACT
  Filled 2023-04-29 (×2): qty 28

## 2023-04-29 MED ORDER — IBUPROFEN 800 MG PO TABS
800.0000 mg | ORAL_TABLET | Freq: Once | ORAL | Status: AC
  Administered 2023-04-29: 800 mg via ORAL
  Filled 2023-04-29: qty 1

## 2023-04-29 MED ORDER — ONDANSETRON HCL 4 MG/2ML IJ SOLN
4.0000 mg | Freq: Once | INTRAMUSCULAR | Status: AC
  Administered 2023-04-29: 4 mg via INTRAVENOUS
  Filled 2023-04-29: qty 2

## 2023-04-29 MED ORDER — ONDANSETRON HCL 4 MG PO TABS: 4.0000 mg | ORAL_TABLET | Freq: Four times a day (QID) | ORAL | Status: DC | PRN

## 2023-04-29 MED ORDER — SODIUM CHLORIDE 0.9% FLUSH
3.0000 mL | Freq: Two times a day (BID) | INTRAVENOUS | Status: DC
  Administered 2023-04-29 – 2023-04-30 (×3): 3 mL via INTRAVENOUS

## 2023-04-29 MED ORDER — MONTELUKAST SODIUM 10 MG PO TABS
10.0000 mg | ORAL_TABLET | Freq: Every day | ORAL | Status: DC
  Administered 2023-04-29 – 2023-04-30 (×2): 10 mg via ORAL
  Filled 2023-04-29 (×2): qty 1

## 2023-04-29 MED ORDER — ALBUTEROL SULFATE (2.5 MG/3ML) 0.083% IN NEBU: 2.5000 mg | INHALATION_SOLUTION | RESPIRATORY_TRACT | Status: DC | PRN

## 2023-04-29 MED ORDER — PIPERACILLIN-TAZOBACTAM 3.375 G IVPB 30 MIN
3.3750 g | Freq: Once | INTRAVENOUS | Status: AC
  Administered 2023-04-29: 3.375 g via INTRAVENOUS
  Filled 2023-04-29: qty 50

## 2023-04-29 MED ORDER — ENOXAPARIN SODIUM 40 MG/0.4ML IJ SOSY
40.0000 mg | PREFILLED_SYRINGE | INTRAMUSCULAR | Status: DC
  Administered 2023-04-29 – 2023-04-30 (×2): 40 mg via SUBCUTANEOUS
  Filled 2023-04-29 (×2): qty 0.4

## 2023-04-29 MED ORDER — ACETAMINOPHEN 325 MG PO TABS: 650.0000 mg | ORAL_TABLET | Freq: Four times a day (QID) | ORAL | Status: DC | PRN

## 2023-04-29 MED ORDER — INSULIN GLARGINE-YFGN 100 UNIT/ML ~~LOC~~ SOLN
20.0000 [IU] | Freq: Every day | SUBCUTANEOUS | Status: DC
  Administered 2023-04-29: 20 [IU] via SUBCUTANEOUS
  Filled 2023-04-29 (×2): qty 0.2

## 2023-04-29 MED ORDER — SODIUM ZIRCONIUM CYCLOSILICATE 10 G PO PACK
10.0000 g | PACK | Freq: Once | ORAL | Status: AC
  Administered 2023-04-29: 10 g via ORAL
  Filled 2023-04-29: qty 1

## 2023-04-29 MED ORDER — METOPROLOL SUCCINATE ER 25 MG PO TB24: 100.0000 mg | ORAL_TABLET | Freq: Every day | ORAL | Status: DC

## 2023-04-29 MED ORDER — INSULIN ASPART 100 UNIT/ML IJ SOLN
0.0000 [IU] | Freq: Three times a day (TID) | INTRAMUSCULAR | Status: DC
  Administered 2023-04-29: 5 [IU] via SUBCUTANEOUS

## 2023-04-29 MED ORDER — SODIUM BICARBONATE 650 MG PO TABS: 650.0000 mg | ORAL_TABLET | Freq: Two times a day (BID) | ORAL | Status: DC

## 2023-04-29 MED ORDER — ALLOPURINOL 300 MG PO TABS
300.0000 mg | ORAL_TABLET | Freq: Every day | ORAL | Status: DC
  Administered 2023-04-29 – 2023-04-30 (×2): 300 mg via ORAL
  Filled 2023-04-29: qty 1
  Filled 2023-04-29: qty 3

## 2023-04-29 MED ORDER — ACETAMINOPHEN 650 MG RE SUPP: 650.0000 mg | Freq: Four times a day (QID) | RECTAL | Status: DC | PRN

## 2023-04-29 MED ORDER — SODIUM CHLORIDE 0.9 % IV SOLN
3.0000 g | Freq: Four times a day (QID) | INTRAVENOUS | Status: DC
  Administered 2023-04-30 – 2023-05-01 (×6): 3 g via INTRAVENOUS
  Filled 2023-04-29 (×6): qty 8

## 2023-04-29 MED ORDER — ACETAMINOPHEN 500 MG PO TABS
1000.0000 mg | ORAL_TABLET | Freq: Once | ORAL | Status: AC
  Administered 2023-04-29: 1000 mg via ORAL
  Filled 2023-04-29: qty 2

## 2023-04-29 NOTE — ED Notes (Signed)
 Pt OTF with transport, in no new onset distress at this time.
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
## 2023-04-29 NOTE — ED Notes (Signed)
 CBG 432.

## 2023-04-29 NOTE — Progress Notes (Signed)
 Orthopedic Tech Progress Note Patient Details:  CHALES PELISSIER 1960-05-25 [id number]  Patient ID: Walda Guiles, male   DOB: 1960/08/16, 63 y.o.   MRN: [id number] I attended trauma page. Terryann Fiddler 04/29/2023, 2:18 AM

## 2023-04-29 NOTE — ED Notes (Signed)
 Pt requesting to use his own insulin  pump but agrees to use sliding sale for time being.

## 2023-04-29 NOTE — ED Notes (Signed)
 Bear hugger removed.

## 2023-04-29 NOTE — ED Notes (Signed)
 Blood sugar checked due to pts insulin  pump beeping and pt becoming symptomatic. BS found to be 423. Adhikari MD aware via secure text.
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
## 2023-04-29 NOTE — Progress Notes (Signed)
 Transition of Care [facility]) - CAGE-AID Screening   Patient Details  Name: Martin Morales MRN: [id number] Date of Birth: [birth year]  [facility]) CM/SW Contact:    Marvis Sluder, RN Phone Number: 04/29/2023, 7:05 AM   CAGE-AID Screening:    Have You Ever Felt You Ought to Cut Down on Your Drinking or Drug Use?: No Have People Annoyed You By Critizing Your Drinking Or Drug Use?: No Have You Felt Bad Or Guilty About Your Drinking Or Drug Use?: No Have You Ever Had a Drink or Used Drugs First Thing In The Morning to Steady Your Nerves or to Get Rid of a Hangover?: No CAGE-AID Score: 0  Substance Abuse Education Offered: No

## 2023-04-29 NOTE — Progress Notes (Signed)
  X-cover Note:  Pt is insistent on using his own insulin  pump. Refuses to use hospital SSI.  Unk Garb, DO [facility]
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
## 2023-04-29 NOTE — ED Notes (Signed)
 Sputum collection cup explained and set at bedside.
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
## 2023-04-29 NOTE — ED Notes (Signed)
 Per main lab, they did not receive initial blood that was sent. Lab work recollected by this Charity fundraiser

## 2023-04-30 ENCOUNTER — Observation Stay (HOSPITAL_COMMUNITY)

## 2023-04-30 DIAGNOSIS — R0682 Tachypnea, not elsewhere classified: Secondary | ICD-10-CM | POA: Diagnosis present

## 2023-04-30 DIAGNOSIS — Z9641 Presence of insulin pump (external) (internal): Secondary | ICD-10-CM | POA: Diagnosis present

## 2023-04-30 DIAGNOSIS — J189 Pneumonia, unspecified organism: Secondary | ICD-10-CM | POA: Diagnosis present

## 2023-04-30 DIAGNOSIS — R404 Transient alteration of awareness: Secondary | ICD-10-CM | POA: Diagnosis present

## 2023-04-30 DIAGNOSIS — I509 Heart failure, unspecified: Secondary | ICD-10-CM | POA: Diagnosis not present

## 2023-04-30 DIAGNOSIS — E039 Hypothyroidism, unspecified: Secondary | ICD-10-CM | POA: Diagnosis present

## 2023-04-30 DIAGNOSIS — R680 Hypothermia, not associated with low environmental temperature: Secondary | ICD-10-CM | POA: Diagnosis present

## 2023-04-30 DIAGNOSIS — J690 Pneumonitis due to inhalation of food and vomit: Secondary | ICD-10-CM | POA: Diagnosis present

## 2023-04-30 DIAGNOSIS — G9341 Metabolic encephalopathy: Secondary | ICD-10-CM | POA: Diagnosis present

## 2023-04-30 DIAGNOSIS — N1832 Chronic kidney disease, stage 3b: Secondary | ICD-10-CM | POA: Diagnosis present

## 2023-04-30 DIAGNOSIS — I129 Hypertensive chronic kidney disease with stage 1 through stage 4 chronic kidney disease, or unspecified chronic kidney disease: Secondary | ICD-10-CM | POA: Diagnosis present

## 2023-04-30 DIAGNOSIS — J45909 Unspecified asthma, uncomplicated: Secondary | ICD-10-CM | POA: Diagnosis present

## 2023-04-30 DIAGNOSIS — E1022 Type 1 diabetes mellitus with diabetic chronic kidney disease: Secondary | ICD-10-CM | POA: Diagnosis present

## 2023-04-30 DIAGNOSIS — E10649 Type 1 diabetes mellitus with hypoglycemia without coma: Secondary | ICD-10-CM | POA: Diagnosis not present

## 2023-04-30 DIAGNOSIS — A419 Sepsis, unspecified organism: Secondary | ICD-10-CM | POA: Diagnosis present

## 2023-04-30 DIAGNOSIS — Z79899 Other long term (current) drug therapy: Secondary | ICD-10-CM | POA: Diagnosis not present

## 2023-04-30 DIAGNOSIS — Z794 Long term (current) use of insulin: Secondary | ICD-10-CM | POA: Diagnosis not present

## 2023-04-30 DIAGNOSIS — Z7951 Long term (current) use of inhaled steroids: Secondary | ICD-10-CM | POA: Diagnosis not present

## 2023-04-30 DIAGNOSIS — E872 Acidosis, unspecified: Secondary | ICD-10-CM | POA: Diagnosis present

## 2023-04-30 DIAGNOSIS — Z825 Family history of asthma and other chronic lower respiratory diseases: Secondary | ICD-10-CM | POA: Diagnosis not present

## 2023-04-30 DIAGNOSIS — N179 Acute kidney failure, unspecified: Secondary | ICD-10-CM | POA: Diagnosis present

## 2023-04-30 DIAGNOSIS — E162 Hypoglycemia, unspecified: Secondary | ICD-10-CM | POA: Diagnosis not present

## 2023-04-30 DIAGNOSIS — Z7982 Long term (current) use of aspirin: Secondary | ICD-10-CM | POA: Diagnosis not present

## 2023-04-30 DIAGNOSIS — Z833 Family history of diabetes mellitus: Secondary | ICD-10-CM | POA: Diagnosis not present

## 2023-04-30 DIAGNOSIS — E875 Hyperkalemia: Secondary | ICD-10-CM | POA: Diagnosis present

## 2023-04-30 DIAGNOSIS — K219 Gastro-esophageal reflux disease without esophagitis: Secondary | ICD-10-CM | POA: Diagnosis present

## 2023-04-30 DIAGNOSIS — W010XXA Fall on same level from slipping, tripping and stumbling without subsequent striking against object, initial encounter: Secondary | ICD-10-CM | POA: Diagnosis present

## 2023-04-30 DIAGNOSIS — Z79890 Hormone replacement therapy (postmenopausal): Secondary | ICD-10-CM | POA: Diagnosis not present

## 2023-04-30 LAB — ECHOCARDIOGRAM COMPLETE
AR max vel: 1.92 cm2
AV Area VTI: 2.2 cm2
AV Area mean vel: 2.03 cm2
AV Mean grad: 16 mmHg
AV Peak grad: 25.8 mmHg
Ao pk vel: 2.54 m/s
Area-P 1/2: 4.39 cm2
Height: 71 in
S' Lateral: 2.8 cm
Weight: 3739 [oz_av]

## 2023-04-30 LAB — BASIC METABOLIC PANEL WITH GFR
Anion gap: 11 (ref 5–15)
BUN: 49 mg/dL — ABNORMAL HIGH (ref 8–23)
CO2: 22 mmol/L (ref 22–32)
Calcium: 9.1 mg/dL (ref 8.9–10.3)
Chloride: 106 mmol/L (ref 98–111)
Creatinine, Ser: 2.36 mg/dL — ABNORMAL HIGH (ref 0.61–1.24)
GFR, Estimated: 30 mL/min — ABNORMAL LOW (ref >60)
Glucose, Bld: 58 mg/dL — ABNORMAL LOW (ref 70–99)
Potassium: 4.4 mmol/L (ref 3.5–5.1)
Sodium: 139 mmol/L (ref 135–145)

## 2023-04-30 LAB — URINALYSIS, ROUTINE W REFLEX MICROSCOPIC
Bacteria, UA: NONE SEEN
Bilirubin Urine: NEGATIVE
Glucose, UA: NEGATIVE mg/dL
Ketones, ur: 5 mg/dL — AB
Leukocytes,Ua: NEGATIVE
Nitrite: NEGATIVE
Protein, ur: 100 mg/dL — AB
Specific Gravity, Urine: 1.015 (ref 1.005–1.030)
pH: 5 (ref 5.0–8.0)

## 2023-04-30 LAB — CBC
HCT: 32.6 % — ABNORMAL LOW (ref 39.0–52.0)
Hemoglobin: 11 g/dL — ABNORMAL LOW (ref 13.0–17.0)
MCH: 31.1 pg (ref 26.0–34.0)
MCHC: 33.7 g/dL (ref 30.0–36.0)
MCV: 92.1 fL (ref 80.0–100.0)
Platelets: 237 10*3/uL (ref 150–400)
RBC: 3.54 MIL/uL — ABNORMAL LOW (ref 4.22–5.81)
RDW: 14.5 % (ref 11.5–15.5)
WBC: 20.3 10*3/uL — ABNORMAL HIGH (ref 4.0–10.5)
nRBC: 0 % (ref 0.0–0.2)

## 2023-04-30 LAB — GLUCOSE, CAPILLARY
Glucose-Capillary: 102 mg/dL — ABNORMAL HIGH (ref 70–99)
Glucose-Capillary: 147 mg/dL — ABNORMAL HIGH (ref 70–99)

## 2023-04-30 LAB — OSMOLALITY: Osmolality: 300 mosm/kg — ABNORMAL HIGH (ref 275–295)

## 2023-04-30 LAB — CREATININE, URINE, RANDOM: Creatinine, Urine: 88 mg/dL

## 2023-04-30 LAB — PROCALCITONIN: Procalcitonin: 24.33 ng/mL

## 2023-04-30 LAB — T4, FREE: Free T4: 1.18 ng/dL — ABNORMAL HIGH (ref 0.61–1.12)

## 2023-04-30 LAB — BRAIN NATRIURETIC PEPTIDE: B Natriuretic Peptide: 331.8 pg/mL — ABNORMAL HIGH (ref 0.0–100.0)

## 2023-04-30 LAB — C-REACTIVE PROTEIN: CRP: 16.8 mg/dL — ABNORMAL HIGH (ref <1.0)

## 2023-04-30 LAB — TSH: TSH: 0.511 u[IU]/mL (ref 0.350–4.500)

## 2023-04-30 LAB — MRSA NEXT GEN BY PCR, NASAL: MRSA by PCR Next Gen: NOT DETECTED

## 2023-04-30 LAB — URIC ACID: Uric Acid, Serum: 6.9 mg/dL (ref 3.7–8.6)

## 2023-04-30 LAB — OSMOLALITY, URINE: Osmolality, Ur: 516 mosm/kg (ref 300–900)

## 2023-04-30 LAB — SODIUM, URINE, RANDOM: Sodium, Ur: 81 mmol/L

## 2023-04-30 LAB — MAGNESIUM: Magnesium: 1.9 mg/dL (ref 1.7–2.4)

## 2023-04-30 MED ORDER — LACTATED RINGERS IV SOLN: INTRAVENOUS | Status: DC

## 2023-04-30 MED ORDER — SODIUM CHLORIDE 0.9 % IV SOLN
100.0000 mg | Freq: Two times a day (BID) | INTRAVENOUS | Status: DC
  Administered 2023-04-30 (×2): 100 mg via INTRAVENOUS
  Filled 2023-04-30 (×3): qty 100

## 2023-04-30 MED ORDER — INSULIN ASPART 100 UNIT/ML IJ SOLN: 0.0000 [IU] | Freq: Every day | INTRAMUSCULAR | Status: DC

## 2023-04-30 MED ORDER — LACTATED RINGERS IV SOLN: INTRAVENOUS | Status: AC

## 2023-04-30 MED ORDER — INSULIN GLARGINE-YFGN 100 UNIT/ML ~~LOC~~ SOLN: 15.0000 [IU] | Freq: Every day | SUBCUTANEOUS | Status: DC

## 2023-04-30 MED ORDER — INSULIN ASPART 100 UNIT/ML IJ SOLN: 0.0000 [IU] | Freq: Three times a day (TID) | INTRAMUSCULAR | Status: DC
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
## 2023-04-30 NOTE — Progress Notes (Signed)
 Physical Therapy Note  Patient is functioning at a high level of independence and no physical therapy is indicated at this time. PT is signing-off. Please re-order if there is any significant change in status. Thank you for this referral.  Ambulates independently in room, no signs of instability.  SpO2 94% on RA at rest. SpO2 92% ambulating on RA. No dyspnea. Encouraged IS use.    Jory Ng, PT, DPT [facility]  Rehabilitation Services Physical Therapist Office: [phone] Website: [email or web address]

## 2023-04-30 NOTE — Plan of Care (Signed)
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
## 2023-05-01 ENCOUNTER — Other Ambulatory Visit (HOSPITAL_COMMUNITY): Payer: Self-pay

## 2023-05-01 DIAGNOSIS — E162 Hypoglycemia, unspecified: Secondary | ICD-10-CM | POA: Diagnosis not present

## 2023-05-01 LAB — MAGNESIUM: Magnesium: 1.8 mg/dL (ref 1.7–2.4)

## 2023-05-01 LAB — CBC
HCT: 31.1 % — ABNORMAL LOW (ref 39.0–52.0)
Hemoglobin: 10.3 g/dL — ABNORMAL LOW (ref 13.0–17.0)
MCH: 30.7 pg (ref 26.0–34.0)
MCHC: 33.1 g/dL (ref 30.0–36.0)
MCV: 92.8 fL (ref 80.0–100.0)
Platelets: 216 10*3/uL (ref 150–400)
RBC: 3.35 MIL/uL — ABNORMAL LOW (ref 4.22–5.81)
RDW: 14.1 % (ref 11.5–15.5)
WBC: 12.9 10*3/uL — ABNORMAL HIGH (ref 4.0–10.5)
nRBC: 0 % (ref 0.0–0.2)

## 2023-05-01 LAB — BASIC METABOLIC PANEL WITH GFR
Anion gap: 10 (ref 5–15)
BUN: 34 mg/dL — ABNORMAL HIGH (ref 8–23)
CO2: 23 mmol/L (ref 22–32)
Calcium: 8.8 mg/dL — ABNORMAL LOW (ref 8.9–10.3)
Chloride: 105 mmol/L (ref 98–111)
Creatinine, Ser: 1.99 mg/dL — ABNORMAL HIGH (ref 0.61–1.24)
GFR, Estimated: 37 mL/min — ABNORMAL LOW (ref >60)
Glucose, Bld: 82 mg/dL (ref 70–99)
Potassium: 4.3 mmol/L (ref 3.5–5.1)
Sodium: 138 mmol/L (ref 135–145)

## 2023-05-01 LAB — BRAIN NATRIURETIC PEPTIDE: B Natriuretic Peptide: 114.2 pg/mL — ABNORMAL HIGH (ref 0.0–100.0)

## 2023-05-01 LAB — GLUCOSE, CAPILLARY
Glucose-Capillary: 106 mg/dL — ABNORMAL HIGH (ref 70–99)
Glucose-Capillary: 117 mg/dL — ABNORMAL HIGH (ref 70–99)

## 2023-05-01 LAB — PROCALCITONIN: Procalcitonin: 15.26 ng/mL

## 2023-05-01 LAB — PHOSPHORUS: Phosphorus: 3.5 mg/dL (ref 2.5–4.6)

## 2023-05-01 LAB — C-REACTIVE PROTEIN: CRP: 12.4 mg/dL — ABNORMAL HIGH (ref <1.0)

## 2023-05-01 MED ORDER — AMOXICILLIN-POT CLAVULANATE 875-125 MG PO TABS
1.0000 | ORAL_TABLET | Freq: Two times a day (BID) | ORAL | 0 refills | Status: AC
  Filled 2023-05-01: qty 14, 7d supply, fill #0

## 2023-05-01 MED ORDER — AMLODIPINE BESYLATE 10 MG PO TABS: 10.0000 mg | ORAL_TABLET | Freq: Every day | ORAL | Status: DC

## 2023-05-01 MED ORDER — DOXYCYCLINE HYCLATE 100 MG PO TABS
100.0000 mg | ORAL_TABLET | Freq: Two times a day (BID) | ORAL | 0 refills | Status: AC
  Filled 2023-05-01: qty 14, 7d supply, fill #0

## 2023-05-01 NOTE — Discharge Summary (Signed)
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  CREATININE  --   --  1.74*  --  2.01* 2.58* 2.65* 2.71*  --  2.36*  --  1.99*  AST  --   --  36  --   --   --   --   --   --   --   --   --   ALT  --   --  29  --   --   --   --   --   --   --   --   --   ALKPHOS  --   --  93  --   --   --   --   --   --   --   --   --   BILITOT  --   --  0.5  --   --   --   --   --   --   --   --   --   ALBUMIN  --   --  3.2*  --   --   --   --   --   --   --   --   --   CRP  --   --   --   --   --   --   --   --   --  16.8*  --  12.4*  PROCALCITON  --   --   --   --   --   --   --   --   --  24.33  --  15.26  LATICACIDVEN  --  1.2  --  1.6 1.0  --   --   --   --   --   --   --   INR  --   --  1.0  --   --   --   --   --   --   --   --   --   TSH  --   --   --   --   --   --   --   --   --   --  0.511  --   HGBA1C  --   --   --   --  7.5*  --   --   --    --   --   --   --   BNP  --   --   --   --   --   --   --   --  331.8*  --   --  114.2*  MG  --   --   --   --   --   --   --   --   --  1.9  --  1.8  PHOS  --   --   --   --   --   --   --   --   --   --   --  3.5  CALCIUM    < >  --  8.4*  --  8.6* 8.4* 8.4* 8.7*  --  9.1  --  8.8*   < > = values in this interval not displayed.    Total Time in preparing paper work, data evaluation and todays exam - 35 minutes  Signature  -    Lynnwood Sauer M.D on 05/01/2023 at 8:10 AM   -  To page go to [email or web address]

## 2023-05-01 NOTE — TOC Transition Note (Signed)
 Transition of Care [facility]) - Discharge Note   Patient Details  Name: Martin Morales MRN: [id number] Date of Birth: 05/15/1960  [facility] CM/SW Contact:  Eusebio High, RN Phone Number: 05/01/2023, 9:06 AM   Clinical Narrative:     Patient will DC to home today. No TOC needs identified. Patient will follow up as directed on AVS . Family to transport        Barriers to Discharge: Continued Medical Work up   Patient Goals and CMS Choice            Discharge Placement                       Discharge Plan and Services Additional resources added to the After Visit Summary for                                       Social Drivers of Health (SDOH) Interventions SDOH Screenings   Tobacco Use: Unknown (04/29/2023)     Readmission Risk Interventions     No data to display

## 2023-05-01 NOTE — Plan of Care (Signed)
  Problem: Coping: Goal: Ability to adjust to condition or change in health will improve Outcome: Progressing   Problem: Clinical Measurements: Goal: Will remain free from infection Outcome: Progressing   Problem: Coping: Goal: Level of anxiety will decrease Outcome: Progressing

## 2023-05-01 NOTE — Discharge Instructions (Signed)
 Follow with Primary MD Yvonnie Heritage, NP in 3 days   Get CBC, CMP, 2 view Chest X ray -  checked next visit with your primary MD    Activity: As tolerated with Full fall precautions use walker/cane & assistance as needed  Disposition Home   Diet: Heart Healthy Low Carb, monitor your glycemic control on your insulin  pump every hour.  Follow-up with PCP in 3 to 4 days.  Special Instructions: If you have smoked or chewed Tobacco  in the last 2 yrs please stop smoking, stop any regular Alcohol  and or any Recreational drug use.  On your next visit with your primary care physician please Get Medicines reviewed and adjusted.  Please request your Prim.MD to go over all Hospital Tests and Procedure/Radiological results at the follow up, please get all Hospital records sent to your Prim MD by signing hospital release before you go home.  If you experience worsening of your admission symptoms, develop shortness of breath, life threatening emergency, suicidal or homicidal thoughts you must seek medical attention immediately by calling 911 or calling your MD immediately  if symptoms less severe.  You Must read complete instructions/literature along with all the possible adverse reactions/side effects for all the Medicines you take and that have been prescribed to you. Take any new Medicines after you have completely understood and accpet all the possible adverse reactions/side effects.   Do not drive when taking Pain medications.  Do not take more than prescribed Pain, Sleep and Anxiety Medications

## 2023-05-01 NOTE — Plan of Care (Signed)
                                      [facility]                            [address] [place], [place] [address]      Martin Morales was admitted to the Hospital on 04/28/2023 and Discharged  05/01/2023 and should be excused from work/school   for  7 days starting from date -  04/28/2023 , may return to work/school without any restrictions.  Call Lynnwood Sauer MD, [facility]  [phone] with questions.  Lynnwood Sauer M.D on 05/01/2023,at 8:07 AM  [facility]   Office  [phone]

## 2023-05-04 LAB — CULTURE, BLOOD (ROUTINE X 2)
Culture: NO GROWTH
Culture: NO GROWTH

## 2023-05-10 DIAGNOSIS — 419620001 Death: Secondary | SNOMED CT | POA: Diagnosis present

## 2023-05-10 DEATH — deceased

## 2023-07-25 ENCOUNTER — Encounter (INDEPENDENT_AMBULATORY_CARE_PROVIDER_SITE_OTHER): Admitting: Ophthalmology

## 2023-07-25 DIAGNOSIS — H35373 Puckering of macula, bilateral: Secondary | ICD-10-CM | POA: Diagnosis not present

## 2023-07-25 DIAGNOSIS — Z7984 Long term (current) use of oral hypoglycemic drugs: Secondary | ICD-10-CM | POA: Diagnosis not present

## 2023-07-25 DIAGNOSIS — H43811 Vitreous degeneration, right eye: Secondary | ICD-10-CM

## 2023-07-25 DIAGNOSIS — E103592 Type 1 diabetes mellitus with proliferative diabetic retinopathy without macular edema, left eye: Secondary | ICD-10-CM

## 2023-07-25 DIAGNOSIS — E103511 Type 1 diabetes mellitus with proliferative diabetic retinopathy with macular edema, right eye: Secondary | ICD-10-CM

## 2023-07-25 DIAGNOSIS — H35033 Hypertensive retinopathy, bilateral: Secondary | ICD-10-CM

## 2023-07-25 DIAGNOSIS — I10 Essential (primary) hypertension: Secondary | ICD-10-CM

## 2023-11-08 ENCOUNTER — Encounter (INDEPENDENT_AMBULATORY_CARE_PROVIDER_SITE_OTHER): Admitting: Ophthalmology

## 2023-11-08 DIAGNOSIS — Z7984 Long term (current) use of oral hypoglycemic drugs: Secondary | ICD-10-CM

## 2023-11-08 DIAGNOSIS — H35033 Hypertensive retinopathy, bilateral: Secondary | ICD-10-CM

## 2023-11-08 DIAGNOSIS — E113511 Type 2 diabetes mellitus with proliferative diabetic retinopathy with macular edema, right eye: Secondary | ICD-10-CM

## 2023-11-08 DIAGNOSIS — H43813 Vitreous degeneration, bilateral: Secondary | ICD-10-CM

## 2023-11-08 DIAGNOSIS — E113593 Type 2 diabetes mellitus with proliferative diabetic retinopathy without macular edema, bilateral: Secondary | ICD-10-CM | POA: Diagnosis not present

## 2023-11-08 DIAGNOSIS — H35373 Puckering of macula, bilateral: Secondary | ICD-10-CM

## 2023-11-08 DIAGNOSIS — I10 Essential (primary) hypertension: Secondary | ICD-10-CM

## 2024-02-19 ENCOUNTER — Encounter (INDEPENDENT_AMBULATORY_CARE_PROVIDER_SITE_OTHER): Admitting: Ophthalmology
